# Patient Record
Sex: Female | Born: 1960 | Race: White | Hispanic: No | Marital: Married | Smoking: Former smoker
Health system: Southern US, Community
[De-identification: ages and names within clinical notes are randomized; demographics above are authoritative.]

## PROBLEM LIST (undated history)

## (undated) DIAGNOSIS — Z950 Presence of cardiac pacemaker: Secondary | ICD-10-CM

## (undated) DIAGNOSIS — I4891 Unspecified atrial fibrillation: Secondary | ICD-10-CM

## (undated) DIAGNOSIS — F419 Anxiety disorder, unspecified: Secondary | ICD-10-CM

## (undated) DIAGNOSIS — E119 Type 2 diabetes mellitus without complications: Secondary | ICD-10-CM

## (undated) DIAGNOSIS — E039 Hypothyroidism, unspecified: Secondary | ICD-10-CM

## (undated) DIAGNOSIS — K219 Gastro-esophageal reflux disease without esophagitis: Secondary | ICD-10-CM

## (undated) DIAGNOSIS — I10 Essential (primary) hypertension: Secondary | ICD-10-CM

## (undated) DIAGNOSIS — G4730 Sleep apnea, unspecified: Secondary | ICD-10-CM

## (undated) DIAGNOSIS — M1990 Unspecified osteoarthritis, unspecified site: Secondary | ICD-10-CM

## (undated) HISTORY — PX: LAPAROSCOPIC GASTRIC SLEEVE RESECTION WITH HIATAL HERNIA REPAIR: SHX6512

## (undated) HISTORY — PX: TONSILLECTOMY: SUR1361

## (undated) HISTORY — PX: ULNAR SHORTENING WITH BONE GRAFT: SHX6330

## (undated) HISTORY — PX: INSERT / REPLACE / REMOVE PACEMAKER: SUR710

## (undated) HISTORY — PX: CHOLECYSTECTOMY: SHX55

## (undated) HISTORY — PX: ABDOMINAL HYSTERECTOMY: SHX81

## (undated) HISTORY — PX: ACHILLES TENDON REPAIR: SUR1153

## (undated) HISTORY — PX: PLANTAR FASCIA RELEASE: SHX2239

---

## 1998-04-20 ENCOUNTER — Encounter: Admission: RE | Admit: 1998-04-20 | Discharge: 1998-07-19 | Payer: Self-pay | Admitting: Anesthesiology

## 1998-07-01 ENCOUNTER — Ambulatory Visit (HOSPITAL_COMMUNITY): Admission: RE | Admit: 1998-07-01 | Discharge: 1998-07-01 | Payer: Self-pay | Admitting: *Deleted

## 1998-07-24 ENCOUNTER — Ambulatory Visit (HOSPITAL_COMMUNITY): Admission: RE | Admit: 1998-07-24 | Discharge: 1998-07-24 | Payer: Self-pay | Admitting: *Deleted

## 1998-08-21 ENCOUNTER — Ambulatory Visit (HOSPITAL_COMMUNITY): Admission: RE | Admit: 1998-08-21 | Discharge: 1998-08-21 | Payer: Self-pay | Admitting: Family Medicine

## 1998-08-31 ENCOUNTER — Ambulatory Visit (HOSPITAL_COMMUNITY): Admission: RE | Admit: 1998-08-31 | Discharge: 1998-08-31 | Payer: Self-pay | Admitting: Family Medicine

## 1998-08-31 ENCOUNTER — Encounter: Payer: Self-pay | Admitting: Family Medicine

## 1999-06-06 ENCOUNTER — Inpatient Hospital Stay (HOSPITAL_COMMUNITY): Admission: AD | Admit: 1999-06-06 | Discharge: 1999-06-09 | Payer: Self-pay | Admitting: Cardiology

## 1999-12-06 ENCOUNTER — Other Ambulatory Visit: Admission: RE | Admit: 1999-12-06 | Discharge: 1999-12-06 | Payer: Self-pay | Admitting: *Deleted

## 2003-07-29 ENCOUNTER — Encounter: Payer: Self-pay | Admitting: Family Medicine

## 2003-07-29 ENCOUNTER — Encounter: Admission: RE | Admit: 2003-07-29 | Discharge: 2003-07-29 | Payer: Self-pay | Admitting: Family Medicine

## 2003-12-16 ENCOUNTER — Ambulatory Visit (HOSPITAL_COMMUNITY): Admission: RE | Admit: 2003-12-16 | Discharge: 2003-12-17 | Payer: Self-pay | Admitting: Orthopedic Surgery

## 2005-05-17 ENCOUNTER — Ambulatory Visit (HOSPITAL_COMMUNITY): Admission: RE | Admit: 2005-05-17 | Discharge: 2005-05-17 | Payer: Self-pay | Admitting: Obstetrics and Gynecology

## 2005-08-09 ENCOUNTER — Other Ambulatory Visit: Admission: RE | Admit: 2005-08-09 | Discharge: 2005-08-09 | Payer: Self-pay | Admitting: Obstetrics and Gynecology

## 2005-09-10 ENCOUNTER — Encounter: Admission: RE | Admit: 2005-09-10 | Discharge: 2005-10-25 | Payer: Self-pay | Admitting: Family Medicine

## 2005-10-14 ENCOUNTER — Encounter: Admission: RE | Admit: 2005-10-14 | Discharge: 2005-10-14 | Payer: Self-pay | Admitting: Family Medicine

## 2005-10-23 ENCOUNTER — Encounter: Admission: RE | Admit: 2005-10-23 | Discharge: 2005-10-23 | Payer: Self-pay | Admitting: Family Medicine

## 2006-08-13 ENCOUNTER — Ambulatory Visit (HOSPITAL_COMMUNITY): Admission: RE | Admit: 2006-08-13 | Discharge: 2006-08-13 | Payer: Self-pay | Admitting: Obstetrics and Gynecology

## 2007-10-19 ENCOUNTER — Ambulatory Visit (HOSPITAL_COMMUNITY): Admission: RE | Admit: 2007-10-19 | Discharge: 2007-10-19 | Payer: Self-pay | Admitting: Obstetrics and Gynecology

## 2007-11-05 HISTORY — PX: KNEE ARTHROSCOPY: SUR90

## 2008-03-10 ENCOUNTER — Ambulatory Visit (HOSPITAL_COMMUNITY): Admission: RE | Admit: 2008-03-10 | Discharge: 2008-03-11 | Payer: Self-pay | Admitting: Orthopedic Surgery

## 2008-06-09 ENCOUNTER — Ambulatory Visit: Payer: Self-pay | Admitting: Vascular Surgery

## 2008-06-21 ENCOUNTER — Ambulatory Visit: Payer: Self-pay | Admitting: Vascular Surgery

## 2009-02-09 ENCOUNTER — Ambulatory Visit (HOSPITAL_COMMUNITY): Admission: RE | Admit: 2009-02-09 | Discharge: 2009-02-09 | Payer: Self-pay | Admitting: *Deleted

## 2009-06-21 ENCOUNTER — Ambulatory Visit (HOSPITAL_BASED_OUTPATIENT_CLINIC_OR_DEPARTMENT_OTHER): Admission: RE | Admit: 2009-06-21 | Discharge: 2009-06-21 | Payer: Self-pay | Admitting: *Deleted

## 2010-11-24 ENCOUNTER — Encounter: Payer: Self-pay | Admitting: Gastroenterology

## 2010-11-24 ENCOUNTER — Encounter: Payer: Self-pay | Admitting: *Deleted

## 2010-11-25 ENCOUNTER — Encounter: Payer: Self-pay | Admitting: Family Medicine

## 2010-11-25 ENCOUNTER — Encounter: Payer: Self-pay | Admitting: Obstetrics and Gynecology

## 2010-11-25 ENCOUNTER — Encounter: Payer: Self-pay | Admitting: *Deleted

## 2010-11-26 ENCOUNTER — Encounter: Payer: Self-pay | Admitting: Family Medicine

## 2011-02-09 LAB — POCT I-STAT, CHEM 8
Calcium, Ion: 1.19 mmol/L (ref 1.12–1.32)
Creatinine, Ser: 0.8 mg/dL (ref 0.4–1.2)
Glucose, Bld: 117 mg/dL — ABNORMAL HIGH (ref 70–99)
Hemoglobin: 15 g/dL (ref 12.0–15.0)
Sodium: 139 mEq/L (ref 135–145)
TCO2: 28 mmol/L (ref 0–100)

## 2011-03-19 NOTE — Consult Note (Signed)
VASCULAR SURGERY CONSULTATION   Suzanne Bryan, Suzanne Bryan  DOB:  07-06-1961                                       06/21/2008  [id number]#:[id number]   Referred by Dr. Nolon Bussing. Hilts.  The patient was referred for vascular  surgery consultation regarding pain and swelling in both feet.  This 50-  year-old female states that she has been having pain in her feet for  several years and lately has been having some swelling in both feet and  ankle areas.  This worsens as the day progresses.  It is partially  relieved by getting off her feet and elevating the legs.  She has no  history of varicose veins, deep venous thrombosis, thrombophlebitis,  pulmonary emboli or any clotting problems and has had no bleeding or  ulceration in her lower extremities.  She has tried wearing elastic  compression stockings (short leg) for the past month with some  improvement.  She has had problems with tarsal tunnel release surgery on  the left and plantar fasciitis on the right and is currently being  evaluated for various orthopedic problems.  She had a right knee  arthroscopy 3 months ago.   PAST MEDICAL HISTORY:  1. Hypertension.  2. Chronic atrial fibrillation with pacemaker.  3. Hypothyroidism.  4. Negative for coronary artery disease or hyperlipidemia.  5. Negative for diabetes.   PAST SURGICAL HISTORY:  1. Right knee arthroscopy.  2. Release of plantar fasciitis.  3. Partial tunnel release on the left.   FAMILY HISTORY:  Is positive for coronary artery disease in her father,  diabetes in her grandfather.  Negative for stroke.   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  She has not smoked since 1986 and does not use alcohol.   REVIEW OF SYSTEMS:  The patient is obese and has gained 30 pounds  recently she states.  Has dyspnea not only at rest but also with  exertion and has palpitations.  Does have problems with diarrhea, pain  in her legs with walking,  headaches, diffuse joint pain, depression and  visual changes.   ALLERGIES:  Percocet, aspirin and broxi.   MEDICATIONS:  Please see health history form.  These include Coumadin.   PHYSICAL EXAM:  Vital signs:  Blood pressure is 131/90, heart rate is  96, respirations 14.  General:  She is a morbidly obese middle-aged  female who is in no apparent distress.  Alert and oriented x3.  Neck:  Supple, 3+ carotid pulses palpable.  No bruits are audible.  Neurological:  Normal.  No palpable adenopathy in the neck.  Chest:  Clear to auscultation.  Cardiovascular:  Regular rhythm with no murmurs.  Abdomen:  Is obese.  No palpable masses.  She has 3+ femoral, popliteal,  dorsalis pedis and posterior tibial pulses bilaterally.  Both feet are  well-perfused.  There is no evidence of any venous disease with no  hyperpigmentation, ulceration, edema that is appreciable or stasis  ulcers.  She has no point tenderness in the feet.  No evidence of  ischemia.   A venous duplex exam was performed in our lab last week and reveals no  evidence of deep venous thrombosis, deep venous insufficiency or reflux  in both lower extremities including the saphenous veins and deep veins.   I see no evidence  of arterial or venous insufficiency to be causing her  symptoms of pain in the feet and symmetrical swelling.  I think weight  loss would probably improve her symptomatology more than anything and  she is attempting to do that.  I do not think any further vascular  evaluation is indicated at this time.  She could continue to wear  elastic compression stockings for some symptomatic relief for this  symmetrical edema.   Quita Skye Hart [place], M.Bryan.  Electronically Signed  JDL/MEDQ  Bryan:  06/21/2008  T:  06/22/2008  Job:  [id number]   cc:   Lillia Carmel, M.Bryan.

## 2011-03-19 NOTE — Op Note (Signed)
NAME:  BRAXTON, WEISBECKER NO.:  [id number]   MEDICAL RECORD NO.:  [id number]          PATIENT TYPE:  OIB   LOCATION:  5029                         FACILITY:  [facility]   PHYSICIAN:  Nadara Mustard, MD     DATE OF BIRTH:  [birth year]   DATE OF PROCEDURE:  03/10/2008  DATE OF DISCHARGE:                               OPERATIVE REPORT   PREOPERATIVE DIAGNOSIS:  Osteoarthritis of the right knee.   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   PROCEDURE:  1. Ablation chondroplasty of medial femoral condyle, lateral femoral      condyle, patella, and trochlea, back to bleeding viable subcondylar      bone.  2. Partial medial meniscectomy.   SURGEON:  Nadara Mustard, MD.   ANESTHESIA:  General.   ESTIMATED BLOOD LOSS:  Minimal.   ANTIBIOTICS:  None.   DRAINS:  None.   COMPLICATIONS:  None.   DISPOSITION:  To PACU in stable condition.   INDICATIONS FOR PROCEDURE:  The patient is a 50 year old woman with  osteoarthritis of her right knee.  She has pain with activities of daily  living, has failed conservative care, and presents at this time for  arthroscopic intervention.  Risks and benefits were discussed including  infection, neurovascular injury, persistent pain, and need for  additional surgery.  The patient states she understands and wishes to  proceed at this time.   DESCRIPTION OF PROCEDURE:  The patient was brought to OR room 10 and  underwent a general anesthetic.  After adequate level of anesthesia was  obtained, the patient's left lower extremity was prepped using DuraPrep  and draped into a sterile field.  The scope was inserted through the  inferior lateral portal and inferior medial working portal was  established.  Visualization of the medial line joint line with valgus  stress along the knee showed the patient to have a large osteochondral  defect in the medial femoral condyle as well as degenerative tearing of  the medial meniscus.  The patient underwent ablation chondroplasty, back  to bleeding viable subcondylar bone with the shaver and a rasp.  She  underwent partial medial meniscectomy back to a stable margin  examination.  Examination of that showed an intact ACL.  Examination of  lateral joint line in figure-of-four position showed a small 1-cm  diameter osteochondral defect and this was debrided to back to bleeding  viable subcondylar bone with the shaver.  She had an intact lateral  meniscus.  Examination of the patellofemoral joint with knee extended  also showed osteochondral defect of the patella and trochlea.  The  patient underwent ablation chondroplasty back to bleeding viable  subcondylar bone of the patella and trochlea.  There was a small plaque  and this was also debrided.  There were no loose bodies in the medial or  lateral gutters or the suprapatellar pouch.  The instruments were  removed after a survey of all 3 compartments again and showed no loose  bodies.  The instruments were  removed.  The portals were closed using 3-  0 nylon.  The joint was infused with a total of 30 mL of 0.5% Marcaine  plain.  The wounds were covered with Adaptic, orthopedic sponges, ABD  dressing, Webril, and Coban.  The patient was extubated and taken to  PACU in stable condition.  Plan for a 23-hour observation due to the  patient's use of a CPAP and presence of a pacemaker.  Plan to discharge  to home in the morning.      Nadara Mustard, MD  Electronically Signed     MVD/MEDQ  D:  03/10/2008  T:  03/10/2008  Job:  [id number]

## 2011-03-19 NOTE — Op Note (Signed)
NAME:  Suzanne Bryan, Suzanne Bryan NO.:  [id number]   MEDICAL RECORD NO.:  [id number]          PATIENT TYPE:  AMB   LOCATION:  DSC                          FACILITY:  [facility]   PHYSICIAN:  Tennis Must Meyerdierks, M.D.DATE OF BIRTH:  [birth year]   DATE OF PROCEDURE:  06/21/2009  DATE OF DISCHARGE:                               OPERATIVE REPORT   PREOPERATIVE DIAGNOSIS:  Ulnocarpal abutment, right wrist.   POSTOPERATIVE DIAGNOSIS:  Ulnocarpal abutment, right wrist.   PROCEDURE:  Ulnar shortening right forearm.   SURGEON:  Lowell Bouton, MD   ANESTHESIA:  General with axillary block.   OPERATIVE FINDINGS:  The patient had no abnormality of her mid ulna.   PROCEDURE:  Under general anesthesia with a tourniquet on the right arm,  the right arm was prepped and draped in usual fashion and after  exsanguinating the limb the tourniquet was inflated to 250 mmHg.  A  longitudinal incision was made over the subcutaneous border of the ulna  from the styloid proximally.  It was carried down through the  subcutaneous tissues and bleeding points were coagulated.  Blunt  dissection was carried down between the flexor and extensor muscle mass  and the subcutaneous border of the ulna was identified.  Periosteum was  incised with a knife and a Therapist, nutritional was used to elevate the  periosteum.  The Rayhack Ulnar Shortening System was used.  The plate  was free contoured with plate bending irons.  A slightly concave flex  was placed in the plate so that it fit appropriately on the ulna.  The  plate was then removed and the jig was inserted onto the ulna putting  four 3.5-mm screws into the first, second, third and fourth holes.  The  osteotomy was then performed with a saw using the second and first slot.  This allowed for shortening of 3.5 mm.  After the osteotomy was  performed using plenty of saline the jig was removed and the plate was  applied.  The first and second screws  were inserted and then the  shortening device was placed with the third and fourth screws using 4 mm  longer screws.  Once the shortening device was in place, a screwdriver  was used to shorten the bone down so that the osteotomy closed allowing  for the 3.5 mm shortening.  A jig was then applied to place a  compression screw across the osteotomy site and this was done with a 2.7  mm drill bit in the proximal cortex and 2.0 mm drill bit in the distal  cortex.  A 2.7-mm screw was then inserted compressing the osteotomy  site.  Two distal screws were then inserted using a 3.5-mm screws and  the compression device was removed.  The remaining third and fourth  screw holes were filled with the original 14 mm length screws.  X-rays  showed good position of the osteotomy and good length of the screws.  The relative length of the ulna did appear shorter than the radius.  The  wound was then irrigated copiously with saline.  The subcutaneous  tissues were closed with 4-0 Vicryl, the skin with a 3-0 subcuticular  Prolene.  Steri-Strips were applied followed by sterile  dressing.  The vessel loop drain was left in the wound for drainage.  The patient was placed in volar and dorsal splint immobilizing the wrist  and forearm.  She tolerated the procedure well and went to the recovery  room awake in stable and in good condition.      Lowell Bouton, M.D.  Electronically Signed     EMM/MEDQ  D:  06/21/2009  T:  06/22/2009  Job:  [id number]

## 2011-03-19 NOTE — Procedures (Signed)
DUPLEX DEEP VENOUS EXAM - LOWER EXTREMITY   INDICATION:  Bilateral foot pain and swelling.   HISTORY:  Edema:  Bilateral foot swelling.  Trauma/Surgery:  Right knee arthroscopic surgery about 3 months ago, per  patient.  Pain:  Bilateral feet.  PE:  No.  Previous DVT:  No.  Anticoagulants:  No.  Other:   DUPLEX EXAM:                CFV   SFV   PopV  PTV    GSV                R  L  R  L  R  L  R   L  R  L  Thrombosis    0  0  0  0  0  0  0   0  0  0  Spontaneous   +  +  +  +  +  +  +   +  +  +  Phasic        +  +  +  +  +  +  +   +  +  +  Augmentation  +  +  +  +  +  +  +   +  +  +  Compressible  +  +  +  +  +  +  +   +  +  +  Competent     +  +  +  +  +  +  +   +  +  +   Legend:  + - yes  o - no  p - partial  D - decreased   IMPRESSION:  No evidence of deep vein thrombosis or reflux noted in the  bilateral lower extremities.    _____________________________  V. Charlena Cross, MD   CH/MEDQ  D:  06/09/2008  T:  06/09/2008  Job:  [id number]

## 2011-03-22 NOTE — Op Note (Signed)
NAME:  Suzanne Bryan, Suzanne Bryan                       ACCOUNT NO.:  [id number]   MEDICAL RECORD NO.:  [id number]                   PATIENT TYPE:  OIB   LOCATION:  4702                                 FACILITY:  [facility]   PHYSICIAN:  Loreta Ave, M.D.              DATE OF BIRTH:  [birth year]   DATE OF PROCEDURE:  12/16/2003  DATE OF DISCHARGE:                                 OPERATIVE REPORT   PREOPERATIVE DIAGNOSES:  Recalcitrant symptomatic plantar fasciitis right  heel.   POSTOPERATIVE DIAGNOSES:  Recalcitrant symptomatic plantar fasciitis right  heel.   OPERATION/PROCEDURE:  Endoscopic plantar fascia release right heel.   SURGEON:  Loreta Ave, M.D.   ASSISTANT:  Arlys John D. Petrarca, P.A.-C.   ANESTHESIA:  General.   ESTIMATED BLOOD LOSS:  Minimal.   TOURNIQUET TIME:  30 minutes.   SPECIMENS:  None.   CULTURES:  None.   COMPLICATIONS:  None.   DRESSINGS:  Sterile compressive with a wooden shoe.   DESCRIPTION OF PROCEDURE:  The patient brought to the operating room, placed  on the operating table in supine position.  After adequate anesthesia had  been obtained, tourniquet applied, right calf.  Prepped and draped in the  usual sterile fashion.  Exsanguinated with elevation esmarch, tourniquet  inflated to 250 mmHg.  Plantar fascia attachment to the os calcis was  identified with fluoroscopic guidance.  Small incisions were made on the  medial and lateral aspect. The endoscopic instruments were then used to  clear off the plantar side of the plantar fascia and the cannula was placed  across the foot to allow for endoscopic release.  With both fluoroscopic and  arthroscopic guidance, the plantar fascia was identified and with the  endoscopic knife was divided in its entirety from the medial to the lateral  aspect down to the short flexor muscle below.  Good confirmation of release  throughout viewing throughout and protecting neurovascular structures and  skin on both  sides. Once this was accomplished, the wound was irrigated and  injected with Marcaine.  Incisions were closed with nylon. Sterile  compressive dressing applied. Tourniquet deflated and removed.  Wound shield  applied.  Anesthesia reversed. Brought to the recovery room.  She tolerated  the surgery well with no complications.                                              Loreta Ave, M.D.   DFM/MEDQ  D:  12/16/2003  T:  12/17/2003  Job:  [id number]

## 2014-04-06 ENCOUNTER — Ambulatory Visit (INDEPENDENT_AMBULATORY_CARE_PROVIDER_SITE_OTHER): Payer: PRIVATE HEALTH INSURANCE

## 2014-04-06 ENCOUNTER — Encounter: Payer: Self-pay | Admitting: Sports Medicine

## 2014-04-06 ENCOUNTER — Ambulatory Visit (INDEPENDENT_AMBULATORY_CARE_PROVIDER_SITE_OTHER): Payer: PRIVATE HEALTH INSURANCE | Admitting: Sports Medicine

## 2014-04-06 DIAGNOSIS — Z95810 Presence of automatic (implantable) cardiac defibrillator: Secondary | ICD-10-CM

## 2014-04-06 DIAGNOSIS — D1620 Benign neoplasm of long bones of unspecified lower limb: Secondary | ICD-10-CM

## 2014-04-06 DIAGNOSIS — M5380 Other specified dorsopathies, site unspecified: Secondary | ICD-10-CM | POA: Diagnosis not present

## 2014-04-06 DIAGNOSIS — M439 Deforming dorsopathy, unspecified: Secondary | ICD-10-CM | POA: Diagnosis not present

## 2014-04-06 DIAGNOSIS — M899 Disorder of bone, unspecified: Secondary | ICD-10-CM

## 2014-04-06 DIAGNOSIS — Z9889 Other specified postprocedural states: Secondary | ICD-10-CM

## 2014-04-06 DIAGNOSIS — M25529 Pain in unspecified elbow: Secondary | ICD-10-CM

## 2014-04-06 DIAGNOSIS — S9000XA Contusion of unspecified ankle, initial encounter: Secondary | ICD-10-CM

## 2014-04-06 DIAGNOSIS — M25569 Pain in unspecified knee: Secondary | ICD-10-CM | POA: Diagnosis not present

## 2014-04-06 DIAGNOSIS — M949 Disorder of cartilage, unspecified: Secondary | ICD-10-CM

## 2014-04-06 DIAGNOSIS — M7730 Calcaneal spur, unspecified foot: Secondary | ICD-10-CM | POA: Diagnosis not present

## 2014-04-06 DIAGNOSIS — S139XXA Sprain of joints and ligaments of unspecified parts of neck, initial encounter: Secondary | ICD-10-CM

## 2014-04-06 DIAGNOSIS — M5030 Other cervical disc degeneration, unspecified cervical region: Secondary | ICD-10-CM | POA: Diagnosis not present

## 2014-04-06 DIAGNOSIS — R079 Chest pain, unspecified: Secondary | ICD-10-CM

## 2014-04-06 DIAGNOSIS — S335XXA Sprain of ligaments of lumbar spine, initial encounter: Secondary | ICD-10-CM

## 2014-04-06 DIAGNOSIS — M25519 Pain in unspecified shoulder: Secondary | ICD-10-CM

## 2014-04-06 DIAGNOSIS — M4040 Postural lordosis, site unspecified: Secondary | ICD-10-CM | POA: Diagnosis not present

## 2014-04-06 DIAGNOSIS — M25539 Pain in unspecified wrist: Secondary | ICD-10-CM

## 2014-04-06 DIAGNOSIS — M47812 Spondylosis without myelopathy or radiculopathy, cervical region: Secondary | ICD-10-CM | POA: Diagnosis not present

## 2014-04-06 MED ORDER — TRAMADOL HCL 50 MG PO TABS: ORAL_TABLET | ORAL | Status: DC

## 2014-04-06 MED ORDER — PREDNISONE (PAK) 10 MG PO TABS: ORAL_TABLET | ORAL | Status: DC

## 2014-04-06 NOTE — Assessment & Plan Note (Signed)
With cervical strain, lumbar strain, contusion to the right ankle, right knee, and left ribs. Prednisone, continue Skelaxin that she already has, tramadol for breakthrough pain. We have to avoid NSAIDs, she is on xarelto. Return to see me in 2 weeks.
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
## 2014-04-15 ENCOUNTER — Telehealth: Payer: Self-pay

## 2014-04-15 NOTE — Telephone Encounter (Signed)
Patient called stated that she is still in pain all over and the Tramadol is helping but the muscle relaxer isn't working, she is currently on Skelaxin. Please advise that patient is out of town and she will give the name of the pharmacy when I call her back on what to do. Lynnett Langlinais,CMA

## 2014-04-15 NOTE — Telephone Encounter (Signed)
Called left a message on patient vm to call me back regarding the pharmacy that she wants her Flexeril sent to . Darion Milewski,CMA

## 2014-04-15 NOTE — Telephone Encounter (Signed)
Switch her from Skelaxin to Flexeril 10 mg tablets, 0.5-1 tablet every 8 hours as needed for pain/spasm. #30. Refill zero.

## 2014-04-18 ENCOUNTER — Other Ambulatory Visit: Payer: Self-pay

## 2014-04-18 MED ORDER — CYCLOBENZAPRINE HCL 10 MG PO TABS: ORAL_TABLET | ORAL | Status: DC

## 2014-04-18 NOTE — Telephone Encounter (Signed)
Flexeril 10 mg has been sent to [facility] [place]. Coltyn Hanning,CMA

## 2014-04-25 ENCOUNTER — Ambulatory Visit: Payer: PRIVATE HEALTH INSURANCE | Admitting: Sports Medicine

## 2014-04-27 ENCOUNTER — Other Ambulatory Visit: Payer: Self-pay | Admitting: Sports Medicine

## 2014-04-28 ENCOUNTER — Encounter: Payer: Self-pay | Admitting: Sports Medicine

## 2014-04-28 ENCOUNTER — Telehealth: Payer: Self-pay | Admitting: *Deleted

## 2014-04-28 ENCOUNTER — Ambulatory Visit (INDEPENDENT_AMBULATORY_CARE_PROVIDER_SITE_OTHER): Payer: PRIVATE HEALTH INSURANCE | Admitting: Sports Medicine

## 2014-04-28 VITALS — BP 121/87 | HR 82 | Ht 64.0 in | Wt 210.0 lb

## 2014-04-28 DIAGNOSIS — M1711 Unilateral primary osteoarthritis, right knee: Secondary | ICD-10-CM | POA: Insufficient documentation

## 2014-04-28 DIAGNOSIS — M1710 Unilateral primary osteoarthritis, unspecified knee: Secondary | ICD-10-CM

## 2014-04-28 DIAGNOSIS — M79609 Pain in unspecified limb: Secondary | ICD-10-CM

## 2014-04-28 DIAGNOSIS — M79671 Pain in right foot: Secondary | ICD-10-CM | POA: Insufficient documentation

## 2014-04-28 DIAGNOSIS — M5136 Other intervertebral disc degeneration, lumbar region: Secondary | ICD-10-CM | POA: Insufficient documentation

## 2014-04-28 DIAGNOSIS — M5030 Other cervical disc degeneration, unspecified cervical region: Secondary | ICD-10-CM

## 2014-04-28 DIAGNOSIS — M5137 Other intervertebral disc degeneration, lumbosacral region: Secondary | ICD-10-CM

## 2014-04-28 DIAGNOSIS — M51379 Other intervertebral disc degeneration, lumbosacral region without mention of lumbar back pain or lower extremity pain: Secondary | ICD-10-CM

## 2014-04-28 DIAGNOSIS — M51369 Other intervertebral disc degeneration, lumbar region without mention of lumbar back pain or lower extremity pain: Secondary | ICD-10-CM | POA: Insufficient documentation

## 2014-04-28 MED ORDER — VENLAFAXINE HCL 75 MG PO TABS: 75.0000 mg | ORAL_TABLET | Freq: Two times a day (BID) | ORAL | Status: DC

## 2014-04-28 NOTE — Telephone Encounter (Signed)
Prior auth for CT cervical spine as per [facility] @ [employer] good thru 04/28/14-07/27/14. [id number]. Margette Fast, CMA

## 2014-04-28 NOTE — Assessment & Plan Note (Signed)
Dr. Percell Miller is waiting a month before pursuing any further treatment of her knee osteoarthritis.

## 2014-04-28 NOTE — Assessment & Plan Note (Signed)
Has a pacemaker, unable to pursue MRI. CT of the cervical spine, she will then pursue epidurals with Dr. Ron Agee
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
## 2014-04-28 NOTE — Assessment & Plan Note (Addendum)
Persistent radicular symptoms. It sounds as though epidurals have been dramatically effective in the past. She is going to do this with Dr. Ron Agee I am increasing Effexor 225 mg.

## 2014-04-28 NOTE — Assessment & Plan Note (Signed)
Injections placed into the first intermetatarsal bursae and the extensor hallucis longus tendon sheath. Return in a month.

## 2014-05-04 ENCOUNTER — Telehealth: Payer: Self-pay | Admitting: *Deleted

## 2014-05-04 ENCOUNTER — Other Ambulatory Visit: Payer: Self-pay | Admitting: Sports Medicine

## 2014-05-04 DIAGNOSIS — M5136 Other intervertebral disc degeneration, lumbar region: Secondary | ICD-10-CM

## 2014-05-04 DIAGNOSIS — M51369 Other intervertebral disc degeneration, lumbar region without mention of lumbar back pain or lower extremity pain: Secondary | ICD-10-CM

## 2014-05-04 NOTE — Assessment & Plan Note (Signed)
She is getting lumbar epidurals with [facility]. Per patient request I am ordering a CT of the lumbar spine, due to a pacemaker she cannot have an MRI.

## 2014-05-04 NOTE — Telephone Encounter (Signed)
CT of lumbar w/o was added by Dr. Nydia Bouton today and prior was obtained, radiology was called. Auth from [facility] - [id number] good 05/05/14 thru 08/03/14. Margette Fast, CMA

## 2014-05-11 ENCOUNTER — Telehealth: Payer: Self-pay | Admitting: *Deleted

## 2014-05-11 ENCOUNTER — Ambulatory Visit (INDEPENDENT_AMBULATORY_CARE_PROVIDER_SITE_OTHER): Payer: PRIVATE HEALTH INSURANCE

## 2014-05-11 DIAGNOSIS — M5136 Other intervertebral disc degeneration, lumbar region: Secondary | ICD-10-CM

## 2014-05-11 DIAGNOSIS — M5030 Other cervical disc degeneration, unspecified cervical region: Secondary | ICD-10-CM

## 2014-05-11 DIAGNOSIS — M5126 Other intervertebral disc displacement, lumbar region: Secondary | ICD-10-CM

## 2014-05-11 NOTE — Telephone Encounter (Signed)
I spoke with Nathaneil Canary @ [facility] this morning regarding CT cervical w/o [address] and he updated that prior auth for this procedure and the approval number stays the same. Previously, Arianna from [facility], [place] a with and w/out procedure which was not ordered. Bonnita Nasuti in radiology was notified of update. Margette Fast, CMA

## 2014-05-23 ENCOUNTER — Ambulatory Visit (INDEPENDENT_AMBULATORY_CARE_PROVIDER_SITE_OTHER): Payer: PRIVATE HEALTH INSURANCE | Admitting: Sports Medicine

## 2014-05-23 ENCOUNTER — Encounter: Payer: Self-pay | Admitting: Sports Medicine

## 2014-05-23 VITALS — BP 119/79 | HR 70 | Ht 64.0 in | Wt 212.0 lb

## 2014-05-23 DIAGNOSIS — M5030 Other cervical disc degeneration, unspecified cervical region: Secondary | ICD-10-CM

## 2014-05-23 DIAGNOSIS — M79671 Pain in right foot: Secondary | ICD-10-CM

## 2014-05-23 DIAGNOSIS — M5137 Other intervertebral disc degeneration, lumbosacral region: Secondary | ICD-10-CM

## 2014-05-23 DIAGNOSIS — M51379 Other intervertebral disc degeneration, lumbosacral region without mention of lumbar back pain or lower extremity pain: Secondary | ICD-10-CM

## 2014-05-23 DIAGNOSIS — M79609 Pain in unspecified limb: Secondary | ICD-10-CM

## 2014-05-23 DIAGNOSIS — M5136 Other intervertebral disc degeneration, lumbar region: Secondary | ICD-10-CM

## 2014-05-23 DIAGNOSIS — M51369 Other intervertebral disc degeneration, lumbar region without mention of lumbar back pain or lower extremity pain: Secondary | ICD-10-CM

## 2014-05-23 NOTE — Assessment & Plan Note (Signed)
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 She will be going to Dr. Ron Agee at [facility] for injections.

## 2014-05-23 NOTE — Assessment & Plan Note (Signed)
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 She will be going to Dr. Ron Agee at [facility] for injections.

## 2014-05-23 NOTE — Assessment & Plan Note (Signed)
Good response to extensor hallucis longus and first intermetatarsal bursa injection at the last visit. Return for custom orthotics, she does have very significant pes cavus.

## 2014-05-23 NOTE — Progress Notes (Signed)
  Subjective:    CC: Followup  HPI: Cervical radiculitis: CT did show multilevel cervical spondylosis, getting set up for epidurals with another provider.  Lumbar radiculitis: CT again showed multilevel lumbar spondylosis, getting set up for epidurals with another provider. She was unable to do an MRI due to implanted device.  Right foot pain: Improved significantly after first intermetatarsal and extensor hallucis longus tendon sheath injection.  Past medical history, Surgical history, Family history not pertinant except as noted below, Social history, Allergies, and medications have been entered into the medical record, reviewed, and no changes needed.   Review of Systems: No fevers, chills, night sweats, weight loss, chest pain, or shortness of breath.   Objective:    General: Well Developed, well nourished, and in no acute distress.  Neuro: Alert and oriented x3, extra-ocular muscles intact, sensation grossly intact.  HEENT: Normocephalic, atraumatic, pupils equal round reactive to light, neck supple, no masses, no lymphadenopathy, thyroid nonpalpable.  Skin: Warm and dry, no rashes. Cardiac: Regular rate and rhythm, no murmurs rubs or gallops, no lower extremity edema.  Respiratory: Clear to auscultation bilaterally. Not using accessory muscles, speaking in full sentences. Right Foot: No visible erythema or swelling. Range of motion is full in all directions. Strength is 5/5 in all directions. No hallux valgus. Pes cavus present. No abnormal callus noted. No pain over the navicular prominence, or base of fifth metatarsal. No tenderness to palpation of the calcaneal insertion of plantar fascia. No pain at the Achilles insertion. No pain over the calcaneal bursa. No pain of the retrocalcaneal bursa. No tenderness to palpation over the tarsals, metatarsals, or phalanges. No hallux rigidus or limitus. No tenderness palpation over interphalangeal joints. No pain with compression  of the metatarsal heads. Neurovascularly intact distally.  Impression and Recommendations:    I spent 40 minutes with this patient, greater than 50% was face-to-face time counseling regarding the above diagnosis

## 2014-06-03 ENCOUNTER — Ambulatory Visit: Payer: PRIVATE HEALTH INSURANCE | Admitting: Sports Medicine

## 2014-06-03 ENCOUNTER — Ambulatory Visit (INDEPENDENT_AMBULATORY_CARE_PROVIDER_SITE_OTHER): Payer: PRIVATE HEALTH INSURANCE

## 2014-06-03 DIAGNOSIS — M6281 Muscle weakness (generalized): Secondary | ICD-10-CM

## 2014-06-03 DIAGNOSIS — M542 Cervicalgia: Secondary | ICD-10-CM

## 2014-06-03 DIAGNOSIS — M1710 Unilateral primary osteoarthritis, unspecified knee: Secondary | ICD-10-CM

## 2014-06-03 DIAGNOSIS — Z0289 Encounter for other administrative examinations: Secondary | ICD-10-CM

## 2014-06-06 ENCOUNTER — Encounter: Payer: PRIVATE HEALTH INSURANCE | Admitting: Physical Therapy

## 2014-06-06 ENCOUNTER — Encounter (INDEPENDENT_AMBULATORY_CARE_PROVIDER_SITE_OTHER): Payer: PRIVATE HEALTH INSURANCE | Admitting: Physical Therapy

## 2014-06-06 DIAGNOSIS — M1710 Unilateral primary osteoarthritis, unspecified knee: Secondary | ICD-10-CM

## 2014-06-06 DIAGNOSIS — M545 Low back pain, unspecified: Secondary | ICD-10-CM

## 2014-06-06 DIAGNOSIS — M542 Cervicalgia: Secondary | ICD-10-CM

## 2014-06-06 DIAGNOSIS — M6281 Muscle weakness (generalized): Secondary | ICD-10-CM

## 2014-06-06 DIAGNOSIS — R5381 Other malaise: Secondary | ICD-10-CM

## 2014-06-14 ENCOUNTER — Encounter (INDEPENDENT_AMBULATORY_CARE_PROVIDER_SITE_OTHER): Payer: PRIVATE HEALTH INSURANCE | Admitting: Physical Therapy

## 2014-06-14 DIAGNOSIS — M545 Low back pain, unspecified: Secondary | ICD-10-CM

## 2014-06-14 DIAGNOSIS — M1710 Unilateral primary osteoarthritis, unspecified knee: Secondary | ICD-10-CM

## 2014-06-14 DIAGNOSIS — R5381 Other malaise: Secondary | ICD-10-CM

## 2014-06-14 DIAGNOSIS — M6281 Muscle weakness (generalized): Secondary | ICD-10-CM

## 2014-06-14 DIAGNOSIS — M542 Cervicalgia: Secondary | ICD-10-CM

## 2014-06-17 ENCOUNTER — Encounter: Payer: PRIVATE HEALTH INSURANCE | Admitting: Physical Therapy

## 2014-06-21 ENCOUNTER — Encounter (INDEPENDENT_AMBULATORY_CARE_PROVIDER_SITE_OTHER): Payer: PRIVATE HEALTH INSURANCE | Admitting: Physical Therapy

## 2014-06-21 DIAGNOSIS — M545 Low back pain, unspecified: Secondary | ICD-10-CM

## 2014-06-21 DIAGNOSIS — M6281 Muscle weakness (generalized): Secondary | ICD-10-CM

## 2014-06-21 DIAGNOSIS — M542 Cervicalgia: Secondary | ICD-10-CM

## 2014-06-21 DIAGNOSIS — R5381 Other malaise: Secondary | ICD-10-CM

## 2014-06-21 DIAGNOSIS — M1710 Unilateral primary osteoarthritis, unspecified knee: Secondary | ICD-10-CM

## 2014-06-27 ENCOUNTER — Encounter (INDEPENDENT_AMBULATORY_CARE_PROVIDER_SITE_OTHER): Payer: PRIVATE HEALTH INSURANCE | Admitting: Physical Therapy

## 2014-06-27 DIAGNOSIS — M545 Low back pain, unspecified: Secondary | ICD-10-CM

## 2014-06-27 DIAGNOSIS — M542 Cervicalgia: Secondary | ICD-10-CM

## 2014-06-27 DIAGNOSIS — M6281 Muscle weakness (generalized): Secondary | ICD-10-CM

## 2014-06-27 DIAGNOSIS — R5381 Other malaise: Secondary | ICD-10-CM

## 2014-06-27 DIAGNOSIS — M1710 Unilateral primary osteoarthritis, unspecified knee: Secondary | ICD-10-CM

## 2014-06-30 ENCOUNTER — Encounter: Payer: PRIVATE HEALTH INSURANCE | Admitting: Physical Therapy

## 2014-10-25 ENCOUNTER — Other Ambulatory Visit: Payer: Self-pay | Admitting: Physician Assistant
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
## 2014-10-26 NOTE — Pre-Procedure Instructions (Signed)
Suzanne Bryan  10/26/2014   Your procedure is scheduled on:  Wednesday, January 6th  Report to [facility] at [phone] AM.  Call this number if you have problems the morning of surgery: [phone]   Remember:   Do not eat food or drink liquids after midnight.   Take these medicines the morning of surgery with A SIP OF WATER: norvasc, synthroid, prilosec, effexor, tramadol if needed   Do not wear jewelry, make-up or nail polish.  Do not wear lotions, powders, or perfume, deodorant.  Do not shave 48 hours prior to surgery. Men may shave face and neck.  Do not bring valuables to the hospital.  [facility] is not responsible  for any belongings or valuables.               Contacts, dentures or bridgework may not be worn into surgery.  Leave suitcase in the car. After surgery it may be brought to your room.  For patients admitted to the hospital, discharge time is determined by your  treatment team.         Please read over the following fact sheets that you were given: Pain Booklet, Coughing and Deep Breathing, Blood Transfusion Information, MRSA Information and Surgical Site Infection Prevention  [place] - Preparing for Surgery  Before surgery, you can play an important role.  Because skin is not sterile, your skin needs to be as free of germs as possible.  You can reduce the number of germs on you skin by washing with CHG (chlorahexidine gluconate) soap before surgery.  CHG is an antiseptic cleaner which kills germs and bonds with the skin to continue killing germs even after washing.  Please DO NOT use if you have an allergy to CHG or antibacterial soaps.  If your skin becomes reddened/irritated stop using the CHG and inform your nurse when you arrive at Short Stay.  Do not shave (including legs and underarms) for at least 48 hours prior to the first CHG shower.  You may shave your face.  Please follow these instructions carefully:   1.  Shower with CHG Soap  the night before surgery and the morning of Surgery.  2.  If you choose to wash your hair, wash your hair first as usual with your normal shampoo.  3.  After you shampoo, rinse your hair and body thoroughly to remove the shampoo.  4.  Use CHG as you would any other liquid soap.  You can apply CHG directly to the skin and wash gently with scrungie or a clean washcloth.  5.  Apply the CHG Soap to your body ONLY FROM THE NECK DOWN.  Do not use on open wounds or open sores.  Avoid contact with your eyes, ears, mouth and genitals (private parts).  Wash genitals (private parts) with your normal soap.  6.  Wash thoroughly, paying special attention to the area where your surgery will be performed.  7.  Thoroughly rinse your body with warm water from the neck down.  8.  DO NOT shower/wash with your normal soap after using and rinsing off the CHG Soap.  9.  Pat yourself dry with a clean towel.            10.  Wear clean pajamas.            11.  Place clean sheets on your bed the night of your first shower and do not sleep with pets.  Day of Surgery  Do not apply any lotions/deoderants the morning of surgery.  Please wear clean clothes to the hospital/surgery center.

## 2014-10-27 ENCOUNTER — Encounter (HOSPITAL_COMMUNITY)
Admission: RE | Admit: 2014-10-27 | Discharge: 2014-10-27 | Disposition: A | Discharge status: Home / Self Care | Payer: PRIVATE HEALTH INSURANCE | Source: Ambulatory Visit | Attending: Orthopedic Surgery | Admitting: Orthopedic Surgery

## 2014-10-27 ENCOUNTER — Encounter (HOSPITAL_COMMUNITY): Payer: Self-pay

## 2014-10-27 DIAGNOSIS — I272 Other secondary pulmonary hypertension: Secondary | ICD-10-CM | POA: Insufficient documentation

## 2014-10-27 DIAGNOSIS — E039 Hypothyroidism, unspecified: Secondary | ICD-10-CM | POA: Diagnosis not present

## 2014-10-27 DIAGNOSIS — I482 Chronic atrial fibrillation: Secondary | ICD-10-CM | POA: Insufficient documentation

## 2014-10-27 DIAGNOSIS — M1711 Unilateral primary osteoarthritis, right knee: Secondary | ICD-10-CM | POA: Insufficient documentation

## 2014-10-27 DIAGNOSIS — E669 Obesity, unspecified: Secondary | ICD-10-CM | POA: Diagnosis not present

## 2014-10-27 DIAGNOSIS — Z01818 Encounter for other preprocedural examination: Secondary | ICD-10-CM | POA: Insufficient documentation

## 2014-10-27 DIAGNOSIS — Z90710 Acquired absence of both cervix and uterus: Secondary | ICD-10-CM | POA: Diagnosis not present

## 2014-10-27 DIAGNOSIS — G4733 Obstructive sleep apnea (adult) (pediatric): Secondary | ICD-10-CM | POA: Insufficient documentation

## 2014-10-27 DIAGNOSIS — I4892 Unspecified atrial flutter: Secondary | ICD-10-CM | POA: Insufficient documentation

## 2014-10-27 DIAGNOSIS — M1990 Unspecified osteoarthritis, unspecified site: Secondary | ICD-10-CM | POA: Diagnosis not present

## 2014-10-27 DIAGNOSIS — E119 Type 2 diabetes mellitus without complications: Secondary | ICD-10-CM | POA: Diagnosis not present

## 2014-10-27 DIAGNOSIS — K219 Gastro-esophageal reflux disease without esophagitis: Secondary | ICD-10-CM | POA: Diagnosis not present

## 2014-10-27 DIAGNOSIS — I10 Essential (primary) hypertension: Secondary | ICD-10-CM | POA: Insufficient documentation

## 2014-10-27 DIAGNOSIS — Z87891 Personal history of nicotine dependence: Secondary | ICD-10-CM | POA: Diagnosis not present

## 2014-10-27 DIAGNOSIS — Z950 Presence of cardiac pacemaker: Secondary | ICD-10-CM | POA: Diagnosis not present

## 2014-10-27 DIAGNOSIS — F419 Anxiety disorder, unspecified: Secondary | ICD-10-CM | POA: Insufficient documentation

## 2014-10-27 DIAGNOSIS — Z9049 Acquired absence of other specified parts of digestive tract: Secondary | ICD-10-CM | POA: Insufficient documentation

## 2014-10-27 DIAGNOSIS — Z9884 Bariatric surgery status: Secondary | ICD-10-CM | POA: Diagnosis not present

## 2014-10-27 HISTORY — DX: Unspecified atrial fibrillation: I48.91

## 2014-10-27 HISTORY — DX: Type 2 diabetes mellitus without complications: E11.9

## 2014-10-27 HISTORY — DX: Unspecified osteoarthritis, unspecified site: M19.90

## 2014-10-27 HISTORY — DX: Presence of cardiac pacemaker: Z95.0

## 2014-10-27 HISTORY — DX: Sleep apnea, unspecified: G47.30

## 2014-10-27 HISTORY — DX: Essential (primary) hypertension: I10

## 2014-10-27 HISTORY — DX: Hypothyroidism, unspecified: E03.9

## 2014-10-27 HISTORY — DX: Anxiety disorder, unspecified: F41.9

## 2014-10-27 HISTORY — DX: Gastro-esophageal reflux disease without esophagitis: K21.9

## 2014-10-27 LAB — CBC WITH DIFFERENTIAL/PLATELET
Basophils Absolute: 0 10*3/uL (ref 0.0–0.1)
Basophils Relative: 0 % (ref 0–1)
Eosinophils Absolute: 0.2 10*3/uL (ref 0.0–0.7)
Eosinophils Relative: 2 % (ref 0–5)
HCT: 39.7 % (ref 36.0–46.0)
Hemoglobin: 12.8 g/dL (ref 12.0–15.0)
Lymphocytes Relative: 37 % (ref 12–46)
Lymphs Abs: 2.5 10*3/uL (ref 0.7–4.0)
MCH: 28.9 pg (ref 26.0–34.0)
MCHC: 32.2 g/dL (ref 30.0–36.0)
MCV: 89.6 fL (ref 78.0–100.0)
Monocytes Absolute: 0.6 10*3/uL (ref 0.1–1.0)
Monocytes Relative: 9 % (ref 3–12)
Neutro Abs: 3.5 10*3/uL (ref 1.7–7.7)
Neutrophils Relative %: 52 % (ref 43–77)
Platelets: 224 10*3/uL (ref 150–400)
RBC: 4.43 MIL/uL (ref 3.87–5.11)
RDW: 13 % (ref 11.5–15.5)
WBC: 6.9 10*3/uL (ref 4.0–10.5)

## 2014-10-27 LAB — URINE MICROSCOPIC-ADD ON

## 2014-10-27 LAB — URINALYSIS, ROUTINE W REFLEX MICROSCOPIC
BILIRUBIN URINE: NEGATIVE
Glucose, UA: NEGATIVE mg/dL
Hgb urine dipstick: NEGATIVE
Ketones, ur: NEGATIVE mg/dL
NITRITE: NEGATIVE
Protein, ur: NEGATIVE mg/dL
Specific Gravity, Urine: 1.02 (ref 1.005–1.030)
UROBILINOGEN UA: 0.2 mg/dL (ref 0.0–1.0)
pH: 6 (ref 5.0–8.0)

## 2014-10-27 LAB — SURGICAL PCR SCREEN
MRSA, PCR: NEGATIVE
STAPHYLOCOCCUS AUREUS: POSITIVE — AB

## 2014-10-27 LAB — COMPREHENSIVE METABOLIC PANEL
ALT: 14 U/L (ref 0–35)
AST: 23 U/L (ref 0–37)
Albumin: 3.9 g/dL (ref 3.5–5.2)
Alkaline Phosphatase: 72 U/L (ref 39–117)
Anion gap: 8 (ref 5–15)
BUN: 10 mg/dL (ref 6–23)
CO2: 26 mmol/L (ref 19–32)
Calcium: 9.4 mg/dL (ref 8.4–10.5)
Chloride: 106 mEq/L (ref 96–112)
Creatinine, Ser: 0.93 mg/dL (ref 0.50–1.10)
GFR calc Af Amer: 80 mL/min — ABNORMAL LOW (ref >90)
GFR calc non Af Amer: 69 mL/min — ABNORMAL LOW (ref >90)
Glucose, Bld: 103 mg/dL — ABNORMAL HIGH (ref 70–99)
Potassium: 3.8 mmol/L (ref 3.5–5.1)
Sodium: 140 mmol/L (ref 135–145)
Total Bilirubin: 0.4 mg/dL (ref 0.3–1.2)
Total Protein: 7 g/dL (ref 6.0–8.3)

## 2014-10-27 LAB — TYPE AND SCREEN
ABO/RH(D): A POS
Antibody Screen: NEGATIVE

## 2014-10-27 LAB — ABO/RH: ABO/RH(D): A POS

## 2014-10-27 LAB — PROTIME-INR
INR: 1.51 — AB (ref 0.00–1.49)
Prothrombin Time: 18.3 seconds — ABNORMAL HIGH (ref 11.6–15.2)

## 2014-10-27 LAB — APTT: APTT: 33 s (ref 24–37)

## 2014-10-27 NOTE — Progress Notes (Signed)
Nurse called [facility] and called patient and informed patient of positive PCR results. Patient verbalized understanding.

## 2014-10-27 NOTE — Progress Notes (Signed)
Patient informed Nurse that she had a Medtronic pacemaker and her Cardiologist Dr. Wendie Chess at [facility] handles it. Perioperative pacemaker programming device sheet sent to Cardiologist office. Confirmation fax placed on chart. Patient denied having any acute cardiac or pulmonary issues. Nurse asked patient about Xarelto and patient stated she was instructed to stop taking it 48 hours before surgery on 11/07/14.

## 2014-10-29 LAB — URINE CULTURE

## 2014-10-31 NOTE — Progress Notes (Addendum)
Anesthesia Chart Review:  Patient is a 53 year old female scheduled for right TKR on 11/09/14 by Dr. Kathryne Hitch. Case is posted for general anesthesia.  History includes former smoker, OSA, HTN, chronic afib s/p ablation, CHB s/p Medtronic Syncra CRT-P PPM (originally placed in 2001; last generator change 06/2010; followed by Dr. Wendie Chess with [facility] in [place]), anxiety, GERD, hypothyroidism, DM2, arthritis, hysterectomy, gastric sleeve resection with hiatal hernia repair, cholecystectomy. BMI is consistent with obesity. She reports > 100 lb weight loss over the past several years.  PCP is listed as Edmonia James, PA-C. Last office visit with Dr. Georg Ruddle was 03/08/14 with 12 month physician follow-up, device clinic 3 months.  Meds include Dilaudid, amlodipine, Flexeril, gabapentin,  levothyroxine, omeprazole, Xarelto, tramadol, Effexor.  Preoperative labs noted. Glucose 103.  Her A1C on 10/17/14 was 5.7 ([facility] Every[facility]). PT/INR 18./1.51.  PTT 33.  She is holding Xarelto 48 hours prior to surgery.  Urine culture showed >= 100,000 colonies, but none predominant.  Defer decision to repeat UA/C&S to the surgeon.  10/27/14 EKG:  V-paced rhythm with underlying atrial flutter. No significant change since 03/08/08 ([facility]). Also had afib/flutter pattern on 03/08/14 EKG from Dr. Ignacia Marvel office. Patient with known chronic afib.   Echo 02/10/14 ([facility]): LV normal is normal in size, wall thickness, and wall motion. LVEF 60-65%. Interatrial septum is intact. AV is trileaflet with thin, pliable leaflet that move normally. Unable to adequately determine diastolic dysfunction. LA is severely dilated. Pacemaker lead is in the RV. Moderate 2+ TR. Mildly dilated RA. RVSP is elevated between 40-50 mm Hg, consistent with moderate pulmonary hypertension.  Dr. Ignacia Marvel office completed his own perioperative PPM management form.  Magnet can be used to prevent inhibition. Transcutaneous pads should be  available in the OR. Check ECG post-operatively and if rhythm is unchanged then contact [facility] post-operatively. She is paced 97.7% of the time.  I called and spoke with patient who confirmed that she was told by Dr. Georg Ruddle to hold Xarelto 48 hours prior to surgery. As above she has known chronic afib/flutter. She is asymptomatic. If no acute changes then I would anticipate that she could proceed as planned.  George Hugh [facility] Short Stay Center/Anesthesiology Phone [phone] 11/01/2014 2:31 PM

## 2014-11-08 MED ORDER — CHLORHEXIDINE GLUCONATE 4 % EX LIQD
60.0000 mL | Freq: Once | CUTANEOUS | Status: DC
  Filled 2014-11-08: qty 60

## 2014-11-08 MED ORDER — CEFAZOLIN SODIUM-DEXTROSE 2-3 GM-% IV SOLR
2.0000 g | INTRAVENOUS | Status: AC
  Administered 2014-11-09: 2 g via INTRAVENOUS
  Filled 2014-11-08: qty 50

## 2014-11-08 MED ORDER — LACTATED RINGERS IV SOLN
INTRAVENOUS | Status: DC
  Administered 2014-11-09: 09:00:00 via INTRAVENOUS

## 2014-11-08 NOTE — Progress Notes (Signed)
Patient made aware of surgery time change, arrival time is 0800 AM.

## 2014-11-09 ENCOUNTER — Encounter (HOSPITAL_COMMUNITY): Payer: Self-pay | Admitting: *Deleted

## 2014-11-09 ENCOUNTER — Inpatient Hospital Stay (HOSPITAL_COMMUNITY): Payer: PRIVATE HEALTH INSURANCE | Admitting: Anesthesiology

## 2014-11-09 ENCOUNTER — Inpatient Hospital Stay (HOSPITAL_COMMUNITY)
Admission: RE | Admit: 2014-11-09 | Discharge: 2014-11-13 | DRG: 470 | Disposition: A | Discharge status: Home Health | Payer: PRIVATE HEALTH INSURANCE | Source: Ambulatory Visit | Attending: Orthopedic Surgery | Admitting: Orthopedic Surgery

## 2014-11-09 ENCOUNTER — Inpatient Hospital Stay (HOSPITAL_COMMUNITY): Payer: PRIVATE HEALTH INSURANCE | Admitting: Vascular Surgery

## 2014-11-09 ENCOUNTER — Inpatient Hospital Stay (HOSPITAL_COMMUNITY): Payer: PRIVATE HEALTH INSURANCE

## 2014-11-09 ENCOUNTER — Encounter (HOSPITAL_COMMUNITY)
Admission: RE | Disposition: A | Discharge status: Home Health | Payer: Self-pay | Source: Ambulatory Visit | Attending: Orthopedic Surgery

## 2014-11-09 DIAGNOSIS — D62 Acute posthemorrhagic anemia: Secondary | ICD-10-CM | POA: Diagnosis not present

## 2014-11-09 DIAGNOSIS — Z6836 Body mass index (BMI) 36.0-36.9, adult: Secondary | ICD-10-CM | POA: Diagnosis not present

## 2014-11-09 DIAGNOSIS — F419 Anxiety disorder, unspecified: Secondary | ICD-10-CM | POA: Diagnosis present

## 2014-11-09 DIAGNOSIS — E119 Type 2 diabetes mellitus without complications: Secondary | ICD-10-CM | POA: Diagnosis present

## 2014-11-09 DIAGNOSIS — M1711 Unilateral primary osteoarthritis, right knee: Principal | ICD-10-CM | POA: Diagnosis present

## 2014-11-09 DIAGNOSIS — Z96659 Presence of unspecified artificial knee joint: Secondary | ICD-10-CM

## 2014-11-09 DIAGNOSIS — G4730 Sleep apnea, unspecified: Secondary | ICD-10-CM | POA: Diagnosis present

## 2014-11-09 DIAGNOSIS — Z7901 Long term (current) use of anticoagulants: Secondary | ICD-10-CM | POA: Diagnosis not present

## 2014-11-09 DIAGNOSIS — M25561 Pain in right knee: Secondary | ICD-10-CM | POA: Diagnosis present

## 2014-11-09 DIAGNOSIS — R110 Nausea: Secondary | ICD-10-CM | POA: Diagnosis not present

## 2014-11-09 DIAGNOSIS — Z79891 Long term (current) use of opiate analgesic: Secondary | ICD-10-CM | POA: Diagnosis not present

## 2014-11-09 DIAGNOSIS — E6609 Other obesity due to excess calories: Secondary | ICD-10-CM | POA: Diagnosis present

## 2014-11-09 DIAGNOSIS — K219 Gastro-esophageal reflux disease without esophagitis: Secondary | ICD-10-CM | POA: Diagnosis present

## 2014-11-09 DIAGNOSIS — I10 Essential (primary) hypertension: Secondary | ICD-10-CM | POA: Diagnosis present

## 2014-11-09 DIAGNOSIS — Z950 Presence of cardiac pacemaker: Secondary | ICD-10-CM

## 2014-11-09 DIAGNOSIS — M179 Osteoarthritis of knee, unspecified: Secondary | ICD-10-CM | POA: Diagnosis present

## 2014-11-09 DIAGNOSIS — Z87891 Personal history of nicotine dependence: Secondary | ICD-10-CM

## 2014-11-09 DIAGNOSIS — E039 Hypothyroidism, unspecified: Secondary | ICD-10-CM | POA: Diagnosis present

## 2014-11-09 DIAGNOSIS — M1710 Unilateral primary osteoarthritis, unspecified knee: Secondary | ICD-10-CM | POA: Diagnosis present

## 2014-11-09 DIAGNOSIS — Z79899 Other long term (current) drug therapy: Secondary | ICD-10-CM

## 2014-11-09 HISTORY — PX: TOTAL KNEE ARTHROPLASTY: SHX125

## 2014-11-09 LAB — GLUCOSE, CAPILLARY: GLUCOSE-CAPILLARY: 92 mg/dL (ref 70–99)

## 2014-11-09 SURGERY — ARTHROPLASTY, KNEE, TOTAL
Anesthesia: General | Site: Knee | Laterality: Right

## 2014-11-09 MED ORDER — PROPOFOL 10 MG/ML IV BOLUS
INTRAVENOUS | Status: AC
  Filled 2014-11-09: qty 20

## 2014-11-09 MED ORDER — METOCLOPRAMIDE HCL 5 MG/ML IJ SOLN: 5.0000 mg | Freq: Three times a day (TID) | INTRAMUSCULAR | Status: DC | PRN

## 2014-11-09 MED ORDER — PANTOPRAZOLE SODIUM 40 MG PO TBEC
80.0000 mg | DELAYED_RELEASE_TABLET | Freq: Every day | ORAL | Status: DC
  Administered 2014-11-09 – 2014-11-13 (×5): 80 mg via ORAL
  Filled 2014-11-09 (×4): qty 2

## 2014-11-09 MED ORDER — GABAPENTIN 300 MG PO CAPS
300.0000 mg | ORAL_CAPSULE | Freq: Every day | ORAL | Status: DC
  Administered 2014-11-09 – 2014-11-12 (×4): 300 mg via ORAL
  Filled 2014-11-09 (×5): qty 1

## 2014-11-09 MED ORDER — ONDANSETRON HCL 4 MG/2ML IJ SOLN: 4.0000 mg | Freq: Once | INTRAMUSCULAR | Status: DC | PRN

## 2014-11-09 MED ORDER — HYDROMORPHONE HCL 2 MG PO TABS: ORAL_TABLET | ORAL | Status: DC

## 2014-11-09 MED ORDER — CEFAZOLIN SODIUM-DEXTROSE 2-3 GM-% IV SOLR
INTRAVENOUS | Status: AC
  Filled 2014-11-09: qty 50

## 2014-11-09 MED ORDER — LIDOCAINE HCL (CARDIAC) 20 MG/ML IV SOLN
INTRAVENOUS | Status: DC | PRN
  Administered 2014-11-09: 40 mg via INTRAVENOUS

## 2014-11-09 MED ORDER — PROPOFOL 10 MG/ML IV BOLUS
INTRAVENOUS | Status: DC | PRN
  Administered 2014-11-09: 170 mg via INTRAVENOUS

## 2014-11-09 MED ORDER — FENTANYL CITRATE 0.05 MG/ML IJ SOLN
INTRAMUSCULAR | Status: AC
  Filled 2014-11-09: qty 5

## 2014-11-09 MED ORDER — DEXAMETHASONE SODIUM PHOSPHATE 4 MG/ML IJ SOLN
INTRAMUSCULAR | Status: AC
  Filled 2014-11-09: qty 2

## 2014-11-09 MED ORDER — CEFAZOLIN SODIUM-DEXTROSE 2-3 GM-% IV SOLR
2.0000 g | Freq: Four times a day (QID) | INTRAVENOUS | Status: AC
  Administered 2014-11-09 (×2): 2 g via INTRAVENOUS
  Filled 2014-11-09: qty 50

## 2014-11-09 MED ORDER — 0.9 % SODIUM CHLORIDE (POUR BTL) OPTIME
TOPICAL | Status: DC | PRN
  Administered 2014-11-09: 1000 mL

## 2014-11-09 MED ORDER — ONDANSETRON HCL 4 MG PO TABS: 4.0000 mg | ORAL_TABLET | Freq: Three times a day (TID) | ORAL | Status: AC | PRN

## 2014-11-09 MED ORDER — LIDOCAINE HCL (CARDIAC) 20 MG/ML IV SOLN
INTRAVENOUS | Status: AC
  Filled 2014-11-09: qty 5

## 2014-11-09 MED ORDER — METOCLOPRAMIDE HCL 10 MG PO TABS: 5.0000 mg | ORAL_TABLET | Freq: Three times a day (TID) | ORAL | Status: DC | PRN

## 2014-11-09 MED ORDER — ACETAMINOPHEN 10 MG/ML IV SOLN
INTRAVENOUS | Status: DC | PRN
  Administered 2014-11-09: 1000 mg via INTRAVENOUS

## 2014-11-09 MED ORDER — POTASSIUM CHLORIDE IN NACL 20-0.9 MEQ/L-% IV SOLN
INTRAVENOUS | Status: DC
  Administered 2014-11-09: 100 mL/h via INTRAVENOUS
  Administered 2014-11-10 – 2014-11-12 (×3): via INTRAVENOUS
  Filled 2014-11-09 (×13): qty 1000

## 2014-11-09 MED ORDER — AMLODIPINE BESYLATE 5 MG PO TABS
5.0000 mg | ORAL_TABLET | Freq: Every day | ORAL | Status: DC
  Administered 2014-11-09 – 2014-11-12 (×4): 5 mg via ORAL
  Filled 2014-11-09 (×5): qty 1

## 2014-11-09 MED ORDER — EPHEDRINE SULFATE 50 MG/ML IJ SOLN
INTRAMUSCULAR | Status: DC | PRN
  Administered 2014-11-09: 10 mg via INTRAVENOUS

## 2014-11-09 MED ORDER — SODIUM CHLORIDE 0.9 % IJ SOLN
INTRAMUSCULAR | Status: DC | PRN
  Administered 2014-11-09: 40 mL

## 2014-11-09 MED ORDER — BUPIVACAINE HCL (PF) 0.25 % IJ SOLN
INTRAMUSCULAR | Status: DC | PRN
  Administered 2014-11-09: 10 mL

## 2014-11-09 MED ORDER — INSULIN ASPART 100 UNIT/ML ~~LOC~~ SOLN: 0.0000 [IU] | Freq: Three times a day (TID) | SUBCUTANEOUS | Status: DC

## 2014-11-09 MED ORDER — FENTANYL CITRATE 0.05 MG/ML IJ SOLN
25.0000 ug | INTRAMUSCULAR | Status: DC | PRN
  Administered 2014-11-09: 25 ug via INTRAVENOUS

## 2014-11-09 MED ORDER — FENTANYL CITRATE 0.05 MG/ML IJ SOLN
INTRAMUSCULAR | Status: AC
  Filled 2014-11-09: qty 2

## 2014-11-09 MED ORDER — DIPHENHYDRAMINE HCL 12.5 MG/5ML PO ELIX: 12.5000 mg | ORAL_SOLUTION | ORAL | Status: DC | PRN

## 2014-11-09 MED ORDER — SODIUM CHLORIDE 0.9 % IJ SOLN
INTRAMUSCULAR | Status: AC
  Filled 2014-11-09: qty 10

## 2014-11-09 MED ORDER — SODIUM CHLORIDE 0.9 % IR SOLN
Status: DC | PRN
  Administered 2014-11-09: 1000 mL

## 2014-11-09 MED ORDER — ONDANSETRON HCL 4 MG/2ML IJ SOLN
INTRAMUSCULAR | Status: AC
  Filled 2014-11-09: qty 2

## 2014-11-09 MED ORDER — PHENOL 1.4 % MT LIQD: 1.0000 | OROMUCOSAL | Status: DC | PRN

## 2014-11-09 MED ORDER — CYCLOBENZAPRINE HCL 10 MG PO TABS
10.0000 mg | ORAL_TABLET | Freq: Three times a day (TID) | ORAL | Status: DC
  Administered 2014-11-09 – 2014-11-13 (×10): 10 mg via ORAL
  Filled 2014-11-09 (×16): qty 1

## 2014-11-09 MED ORDER — VENLAFAXINE HCL ER 75 MG PO CP24
225.0000 mg | ORAL_CAPSULE | Freq: Every day | ORAL | Status: DC
  Administered 2014-11-09 – 2014-11-12 (×4): 225 mg via ORAL
  Filled 2014-11-09 (×5): qty 1

## 2014-11-09 MED ORDER — BUPIVACAINE LIPOSOME 1.3 % IJ SUSP
20.0000 mL | INTRAMUSCULAR | Status: AC
  Administered 2014-11-09: 20 mL
  Filled 2014-11-09: qty 20

## 2014-11-09 MED ORDER — MIDAZOLAM HCL 2 MG/2ML IJ SOLN: 1.0000 mg | INTRAMUSCULAR | Status: DC | PRN

## 2014-11-09 MED ORDER — CYCLOBENZAPRINE HCL 10 MG PO TABS
10.0000 mg | ORAL_TABLET | Freq: Three times a day (TID) | ORAL | Status: DC
  Filled 2014-11-09: qty 1

## 2014-11-09 MED ORDER — ONDANSETRON HCL 4 MG/2ML IJ SOLN
INTRAMUSCULAR | Status: DC | PRN
  Administered 2014-11-09: 4 mg via INTRAVENOUS

## 2014-11-09 MED ORDER — MIDAZOLAM HCL 2 MG/2ML IJ SOLN
INTRAMUSCULAR | Status: AC
Start: 2014-11-09 — End: 2014-11-09
  Filled 2014-11-09: qty 2

## 2014-11-09 MED ORDER — MENTHOL 3 MG MT LOZG: 1.0000 | LOZENGE | OROMUCOSAL | Status: DC | PRN

## 2014-11-09 MED ORDER — DEXAMETHASONE SODIUM PHOSPHATE 4 MG/ML IJ SOLN
INTRAMUSCULAR | Status: DC | PRN
  Administered 2014-11-09: 8 mg via INTRAVENOUS

## 2014-11-09 MED ORDER — RIVAROXABAN 20 MG PO TABS
20.0000 mg | ORAL_TABLET | Freq: Every day | ORAL | Status: DC
  Administered 2014-11-10 – 2014-11-12 (×3): 20 mg via ORAL
  Filled 2014-11-09 (×4): qty 1

## 2014-11-09 MED ORDER — ONDANSETRON HCL 4 MG/2ML IJ SOLN
4.0000 mg | Freq: Four times a day (QID) | INTRAMUSCULAR | Status: DC | PRN
  Administered 2014-11-10 – 2014-11-11 (×2): 4 mg via INTRAVENOUS
  Filled 2014-11-09 (×2): qty 2

## 2014-11-09 MED ORDER — LACTATED RINGERS IV SOLN
INTRAVENOUS | Status: DC | PRN
  Administered 2014-11-09 (×2): via INTRAVENOUS

## 2014-11-09 MED ORDER — MIDAZOLAM HCL 2 MG/2ML IJ SOLN
INTRAMUSCULAR | Status: AC
  Filled 2014-11-09: qty 2

## 2014-11-09 MED ORDER — BISACODYL 5 MG PO TBEC: 5.0000 mg | DELAYED_RELEASE_TABLET | Freq: Every day | ORAL | Status: AC | PRN

## 2014-11-09 MED ORDER — BISACODYL 5 MG PO TBEC
5.0000 mg | DELAYED_RELEASE_TABLET | Freq: Every day | ORAL | Status: DC | PRN
Start: 2014-11-09 — End: 2014-11-13

## 2014-11-09 MED ORDER — ONDANSETRON HCL 4 MG PO TABS
4.0000 mg | ORAL_TABLET | Freq: Four times a day (QID) | ORAL | Status: DC | PRN
  Administered 2014-11-09 – 2014-11-11 (×3): 4 mg via ORAL
  Filled 2014-11-09 (×3): qty 1

## 2014-11-09 MED ORDER — HYDROMORPHONE HCL 1 MG/ML IJ SOLN
0.5000 mg | INTRAMUSCULAR | Status: DC | PRN
  Administered 2014-11-10 – 2014-11-12 (×8): 1 mg via INTRAVENOUS
  Filled 2014-11-09 (×8): qty 1

## 2014-11-09 MED ORDER — EPHEDRINE SULFATE 50 MG/ML IJ SOLN
INTRAMUSCULAR | Status: AC
  Filled 2014-11-09: qty 1

## 2014-11-09 MED ORDER — BUPIVACAINE HCL (PF) 0.25 % IJ SOLN
INTRAMUSCULAR | Status: AC
Start: 2014-11-09 — End: 2014-11-09
  Filled 2014-11-09: qty 30

## 2014-11-09 MED ORDER — ACETAMINOPHEN 10 MG/ML IV SOLN
INTRAVENOUS | Status: AC
  Filled 2014-11-09: qty 100

## 2014-11-09 MED ORDER — MIDAZOLAM HCL 5 MG/5ML IJ SOLN
INTRAMUSCULAR | Status: DC | PRN
  Administered 2014-11-09: 2 mg via INTRAVENOUS

## 2014-11-09 MED ORDER — DOCUSATE SODIUM 100 MG PO CAPS
100.0000 mg | ORAL_CAPSULE | Freq: Two times a day (BID) | ORAL | Status: DC
  Administered 2014-11-09 – 2014-11-13 (×8): 100 mg via ORAL
  Filled 2014-11-09 (×10): qty 1

## 2014-11-09 MED ORDER — FENTANYL CITRATE 0.05 MG/ML IJ SOLN
INTRAMUSCULAR | Status: DC | PRN
  Administered 2014-11-09: 100 ug via INTRAVENOUS
  Administered 2014-11-09 (×2): 25 ug via INTRAVENOUS

## 2014-11-09 MED ORDER — LEVOTHYROXINE SODIUM 100 MCG PO TABS
100.0000 ug | ORAL_TABLET | Freq: Every day | ORAL | Status: DC
  Administered 2014-11-10 – 2014-11-11 (×3): 100 ug via ORAL
  Filled 2014-11-09 (×5): qty 1

## 2014-11-09 MED ORDER — MORPHINE SULFATE 15 MG PO TABS
15.0000 mg | ORAL_TABLET | ORAL | Status: DC | PRN
  Administered 2014-11-09 – 2014-11-13 (×8): 15 mg via ORAL
  Filled 2014-11-09 (×8): qty 1

## 2014-11-09 MED ORDER — ACETAMINOPHEN 500 MG PO TABS
1000.0000 mg | ORAL_TABLET | Freq: Four times a day (QID) | ORAL | Status: AC
  Administered 2014-11-09 – 2014-11-10 (×4): 1000 mg via ORAL
  Filled 2014-11-09 (×4): qty 2

## 2014-11-09 SURGICAL SUPPLY — 63 items
APL SKNCLS STERI-STRIP NONHPOA (GAUZE/BANDAGES/DRESSINGS) ×1
BANDAGE ELASTIC 4 VELCRO ST LF (GAUZE/BANDAGES/DRESSINGS) ×2 IMPLANT
BANDAGE ELASTIC 6 VELCRO ST LF (GAUZE/BANDAGES/DRESSINGS) ×2 IMPLANT
BANDAGE ESMARK 6X9 LF (GAUZE/BANDAGES/DRESSINGS) ×1 IMPLANT
BENZOIN TINCTURE PRP APPL 2/3 (GAUZE/BANDAGES/DRESSINGS) ×2 IMPLANT
BLADE SAG 18X100X1.27 (BLADE) ×6 IMPLANT
BNDG CMPR 9X6 STRL LF SNTH (GAUZE/BANDAGES/DRESSINGS) ×1
BNDG ESMARK 6X9 LF (GAUZE/BANDAGES/DRESSINGS) ×3
BOWL SMART MIX CTS (DISPOSABLE) ×3 IMPLANT
CAPT KNEE TOTAL 3 ×2 IMPLANT
CEMENT BONE SIMPLEX SPEEDSET (Cement) ×6 IMPLANT
CLOSURE WOUND 1/2 X4 (GAUZE/BANDAGES/DRESSINGS) ×1
COVER SURGICAL LIGHT HANDLE (MISCELLANEOUS) ×3 IMPLANT
CUFF TOURNIQUET SINGLE 34IN LL (TOURNIQUET CUFF) ×3 IMPLANT
DRAPE EXTREMITY T 121X128X90 (DRAPE) ×3 IMPLANT
DRAPE PROXIMA HALF (DRAPES) ×3 IMPLANT
DRAPE U-SHAPE 47X51 STRL (DRAPES) ×3 IMPLANT
DURAPREP 26ML APPLICATOR (WOUND CARE) ×6 IMPLANT
ELECT CAUTERY BLADE 6.4 (BLADE) ×5 IMPLANT
ELECT REM PT RETURN 9FT ADLT (ELECTROSURGICAL) ×3
ELECTRODE REM PT RTRN 9FT ADLT (ELECTROSURGICAL) ×1 IMPLANT
EVACUATOR 1/8 PVC DRAIN (DRAIN) ×3 IMPLANT
FACESHIELD WRAPAROUND (MASK) ×6 IMPLANT
FACESHIELD WRAPAROUND OR TEAM (MASK) ×2 IMPLANT
GAUZE SPONGE 4X4 12PLY STRL (GAUZE/BANDAGES/DRESSINGS) ×2 IMPLANT
GLOVE BIOGEL PI IND STRL 7.0 (GLOVE) ×2 IMPLANT
GLOVE BIOGEL PI INDICATOR 7.0 (GLOVE) ×2
GOWN STRL REUS W/ TWL LRG LVL3 (GOWN DISPOSABLE) ×1 IMPLANT
GOWN STRL REUS W/ TWL XL LVL3 (GOWN DISPOSABLE) ×1 IMPLANT
GOWN STRL REUS W/TWL LRG LVL3 (GOWN DISPOSABLE) ×6
GOWN STRL REUS W/TWL XL LVL3 (GOWN DISPOSABLE) ×6
HANDPIECE INTERPULSE COAX TIP (DISPOSABLE) ×3
IMMOBILIZER KNEE 22 UNIV (SOFTGOODS) ×2 IMPLANT
KIT BASIN OR (CUSTOM PROCEDURE TRAY) ×3 IMPLANT
KIT ROOM TURNOVER OR (KITS) ×3 IMPLANT
MANIFOLD NEPTUNE II (INSTRUMENTS) ×3 IMPLANT
NDL 18GX1X1/2 (RX/OR ONLY) (NEEDLE) ×1 IMPLANT
NDL HYPO 25GX1X1/2 BEV (NEEDLE) ×1 IMPLANT
NEEDLE 18GX1X1/2 (RX/OR ONLY) (NEEDLE) ×3 IMPLANT
NEEDLE HYPO 25GX1X1/2 BEV (NEEDLE) ×3 IMPLANT
NS IRRIG 1000ML POUR BTL (IV SOLUTION) ×3 IMPLANT
PACK TOTAL JOINT (CUSTOM PROCEDURE TRAY) ×3 IMPLANT
PACK UNIVERSAL I (CUSTOM PROCEDURE TRAY) ×3 IMPLANT
PAD ARMBOARD 7.5X6 YLW CONV (MISCELLANEOUS) ×6 IMPLANT
PADDING CAST ABS 4INX4YD NS (CAST SUPPLIES) ×2
PADDING CAST ABS 6INX4YD NS (CAST SUPPLIES) ×2
PADDING CAST ABS COTTON 4X4 ST (CAST SUPPLIES) IMPLANT
PADDING CAST ABS COTTON 6X4 NS (CAST SUPPLIES) IMPLANT
SET HNDPC FAN SPRY TIP SCT (DISPOSABLE) ×1 IMPLANT
STRIP CLOSURE SKIN 1/2X4 (GAUZE/BANDAGES/DRESSINGS) ×1 IMPLANT
SUCTION FRAZIER TIP 10 FR DISP (SUCTIONS) ×3 IMPLANT
SUT MNCRL AB 4-0 PS2 18 (SUTURE) ×3 IMPLANT
SUT VIC AB 0 CT1 27 (SUTURE)
SUT VIC AB 0 CT1 27XBRD ANBCTR (SUTURE) IMPLANT
SUT VIC AB 1 CT1 27 (SUTURE) ×9
SUT VIC AB 1 CT1 27XBRD ANBCTR (SUTURE) ×2 IMPLANT
SUT VIC AB 2-0 CT1 27 (SUTURE) ×9
SUT VIC AB 2-0 CT1 TAPERPNT 27 (SUTURE) ×2 IMPLANT
SYR 50ML LL SCALE MARK (SYRINGE) ×3 IMPLANT
SYR CONTROL 10ML LL (SYRINGE) ×3 IMPLANT
TOWEL OR 17X24 6PK STRL BLUE (TOWEL DISPOSABLE) ×3 IMPLANT
TOWEL OR 17X26 10 PK STRL BLUE (TOWEL DISPOSABLE) ×3 IMPLANT
WATER STERILE IRR 1000ML POUR (IV SOLUTION) ×2 IMPLANT

## 2014-11-09 NOTE — Transfer of Care (Signed)
Immediate Anesthesia Transfer of Care Note  Patient: Suzanne Bryan  Procedure(s) Performed: Procedure(s): RIGHT TOTAL KNEE ARTHROPLASTY (Right)  Patient Location: PACU  Anesthesia Type:General  Level of Consciousness: awake, oriented and confused  Airway & Oxygen Therapy: Patient Spontanous Breathing and Patient connected to nasal cannula oxygen  Post-op Assessment: Report given to PACU RN and Post -op Vital signs reviewed and stable  Post vital signs: Reviewed and stable  Complications: No apparent anesthesia complications

## 2014-11-09 NOTE — Anesthesia Preprocedure Evaluation (Addendum)
Anesthesia Evaluation  Patient identified by MRN, date of birth, ID band Patient awake    Reviewed: Allergy & Precautions, NPO status , Patient's Chart, lab work & pertinent test results  Airway Mallampati: II  TM Distance: >3 FB Neck ROM: Full    Dental  (+) Teeth Intact, Dental Advisory Given   Pulmonary sleep apnea and Continuous Positive Airway Pressure Ventilation , former smoker,  breath sounds clear to auscultation        Cardiovascular hypertension, Pt. on medications + pacemaker Rhythm:Regular Rate:Normal     Neuro/Psych Anxiety negative neurological ROS     GI/Hepatic Neg liver ROS, GERD-  Medicated and Controlled,  Endo/Other  diabetes, Well Controlled, Type 2Hypothyroidism   Renal/GU negative Renal ROS     Musculoskeletal   Abdominal (+) + obese,   Peds  Hematology   Anesthesia Other Findings   Reproductive/Obstetrics                           Anesthesia Physical Anesthesia Plan  ASA: III  Anesthesia Plan: General   Post-op Pain Management:    Induction: Intravenous  Airway Management Planned: Oral ETT  Additional Equipment:   Intra-op Plan:   Post-operative Plan: Extubation in OR  Informed Consent: I have reviewed the patients History and Physical, chart, labs and discussed the procedure including the risks, benefits and alternatives for the proposed anesthesia with the patient or authorized representative who has indicated his/her understanding and acceptance.   Dental advisory given  Plan Discussed with: CRNA and Anesthesiologist  Anesthesia Plan Comments: (DJD R. Knee Chronic atrial Fib with complete heart block, pacer dependent Hypertension Anxiety  Suzanne Bryan)        Anesthesia Quick Evaluation

## 2014-11-09 NOTE — Discharge Instructions (Signed)
Total Knee Replacement, Care After Refer to this sheet in the next few weeks. These instructions provide you with information on caring for yourself after your procedure. Your health care provider also may give you specific instructions. Your treatment has been planned according to the most current medical practices, but problems sometimes occur. Call your health care provider if you have any problems or questions after your procedure. HOME CARE INSTRUCTIONS   Weight bearing as tolerated.  Resume Xarelto following surgery.  Change bandage daily starting on Saturday.  May shower on Monday, but do not soak incision. May apply ice for up to 20 minutes at a time for pain and swelling.  Follow up appointment in two weeks.    See a physical therapist as directed by your health care provider.  Take medicines only as directed by your health care provider.  Avoid lifting or driving until you are instructed otherwise.  If you have been sent home with a continuous passive motion machine, use it as directed by your health care provider. SEEK MEDICAL CARE IF:  You have difficulty breathing.  You have drainage, redness, swelling, or pain at your incision site.  You have a bad smell coming from your incision site.  You have persistent bleeding from your incision site.  Your incision breaks open after sutures (stitches) or staples have been removed.  You have a fever. SEEK IMMEDIATE MEDICAL CARE IF:   You have a rash.  You have pain or swelling in your calf or thigh.  You have shortness of breath or chest pain.  Your range of motion in your knee is decreasing rather than increasing. MAKE SURE YOU:   Understand these instructions.  Will watch your condition.  Will get help right away if you are not doing well or get worse. Document Released: 05/10/2005 Document Revised: 03/07/2014 Document Reviewed: 12/10/2011 [facility] Patient Information 2015 [place], [place]. This information is not  intended to replace advice given to you by your health care provider. Make sure you discuss any questions you have with your health care provider.
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
## 2014-11-09 NOTE — Progress Notes (Signed)
Pt refuses cpap at this time, states that she would rather wear the oxygen. instructed to notify RT if she changes her mind.

## 2014-11-09 NOTE — Progress Notes (Signed)
Orthopedic Tech Progress Note Patient Details:  Suzanne Bryan 09/10/1961 [id number]  CPM Right Knee CPM Right Knee: On Right Knee Flexion (Degrees): 90 Right Knee Extension (Degrees): 0 Trapeze bar patient helper will be provided when one becomes available; RN notified  Hildred Priest 11/09/2014, 1:14 PM

## 2014-11-09 NOTE — Interval H&P Note (Signed)
History and Physical Interval Note:  11/09/2014 8:32 AM  Suzanne Bryan  has presented today for surgery, with the diagnosis of OA RIGHT KNEE  The various methods of treatment have been discussed with the patient and family. After consideration of risks, benefits and other options for treatment, the patient has consented to  Procedure(s): RIGHT TOTAL KNEE ARTHROPLASTY (Right) as a surgical intervention .  The patient's history has been reviewed, patient examined, no change in status, stable for surgery.  I have reviewed the patient's chart and labs.  Questions were answered to the patient's satisfaction.     Allanna Bresee F

## 2014-11-09 NOTE — Progress Notes (Signed)
Orthopedic Tech Progress Note Patient Details:  Suzanne Bryan [birth year] [id number]  Patient ID: [name], female   DOB: April 18, 1961, 54 y.o.   MRN: [id number] Footsie roll  Hildred Priest 11/09/2014, 1:15 PM

## 2014-11-09 NOTE — Discharge Summary (Addendum)
Patient ID: YUETTE Bryan MRN: [id number] DOB/AGE: May 14, 1961 54 y.o.  Admit date: 11/09/2014 Discharge date: 11/11/2014  Admission Diagnoses:  Active Problems:   DJD (degenerative joint disease) of knee   Discharge Diagnoses:  Same  Past Medical History  Diagnosis Date  . Sleep apnea     Wears CPAP nightly  . Hypertension   . Atrial fibrillation   . Anxiety   . Diabetes mellitus without complication   . Hypothyroidism   . GERD (gastroesophageal reflux disease)   . Arthritis   . Presence of permanent cardiac pacemaker     medtronic    Surgeries: Procedure(s): RIGHT TOTAL KNEE ARTHROPLASTY on 11/09/2014   Consultants:    Discharged Condition: Improved  Hospital Course: Suzanne Bryan is an 54 y.o. female who was admitted 11/09/2014 for operative treatment of primary osteoarthritis of the right knee. Patient has severe unremitting pain that affects sleep, daily activities, and work/hobbies. After pre-op clearance the patient was taken to the operating room on 11/09/2014 and underwent  Procedure(s): RIGHT TOTAL KNEE ARTHROPLASTY.  Patient with a pre-op Hb of 12.8 developed ABLA on pod#1 with a Hb of 9.8 and 9.3 on pod#2.  She is currently asymptomatic but we will continue to follow.    Patient was given perioperative antibiotics:      Anti-infectives    Start     Dose/Rate Route Frequency Ordered Stop   11/09/14 1800  ceFAZolin (ANCEF) IVPB 2 g/50 mL premix     2 g100 mL/hr over 30 Minutes Intravenous 4 times per day 11/09/14 1506 11/09/14 2324   11/09/14 1457  ceFAZolin (ANCEF) 2-3 GM-% IVPB SOLR    Comments:  Reynolds, Lauren   : cabinet override      11/09/14 1457 11/10/14 0314   11/09/14 0600  ceFAZolin (ANCEF) IVPB 2 g/50 mL premix     2 g100 mL/hr over 30 Minutes Intravenous On call to O.R. 11/08/14 1403 11/09/14 1000       Patient was given sequential compression devices, early ambulation, and chemoprophylaxis to prevent DVT.  Patient benefited maximally from  hospital stay and there were no complications.    Recent vital signs:  Patient Vitals for the past 24 hrs:  BP Temp Pulse Resp SpO2  11/11/14 0537 (!) 116/58 mmHg 98.6 F (37 C) 60 16 91 %  11/11/14 0357 - - - 18 99 %  11/11/14 0000 - - - 16 99 %  11/10/14 2151 109/61 mmHg 98.2 F (36.8 C) 73 16 99 %  11/10/14 2000 - - - 16 99 %  11/10/14 1600 - - - 18 94 %  11/10/14 1200 - - - 18 94 %  11/10/14 0800 - - - 18 94 %     Recent laboratory studies:   Recent Labs  11/10/14 0740 11/11/14 0538  WBC 9.6 12.5*  HGB 9.8* 9.3*  HCT 30.8* 30.6*  PLT 199 204  NA 140 139  K 4.5 5.0  CL 107 108  CO2 26 27  BUN 8 11  CREATININE 0.80 0.96  GLUCOSE 111* 130*  CALCIUM 8.6 8.4     Discharge Medications:     Medication List    STOP taking these medications        acetaminophen 500 MG tablet  Commonly known as:  TYLENOL     HYDROmorphone 2 MG tablet  Commonly known as:  DILAUDID     traMADol 50 MG tablet  Commonly known as:  Veatrice Bourbon  TAKE these medications        amLODipine 5 MG tablet  Commonly known as:  NORVASC  Take 5 mg by mouth at bedtime.     bisacodyl 5 MG EC tablet  Commonly known as:  DULCOLAX  Take 1 tablet (5 mg total) by mouth daily as needed for moderate constipation.     CALCIUM 1000 + D PO  Take 1 tablet by mouth daily.     cyanocobalamin 1000 MCG/ML injection  Commonly known as:  (VITAMIN B-12)  Inject 1,000 mcg into the muscle every 30 (thirty) days. Towards the end of the month - last injection 1st week of December     cyclobenzaprine 10 MG tablet  Commonly known as:  FLEXERIL  0.5-1 tablet every 8 hours as needed for pain/spasm     gabapentin 300 MG capsule  Commonly known as:  NEURONTIN  Take 300 mg by mouth at bedtime.     levothyroxine 100 MCG tablet  Commonly known as:  SYNTHROID, LEVOTHROID  Take 100 mcg by mouth at bedtime.     morphine 15 MG tablet  Commonly known as:  MSIR  Take 1 tablet (15 mg total) by mouth every 4  (four) hours as needed for severe pain.     multivitamin with minerals Tabs tablet  Take 2 tablets by mouth daily.     omeprazole 40 MG capsule  Commonly known as:  PRILOSEC  Take 40 mg by mouth 2 (two) times daily.     ondansetron 4 MG tablet  Commonly known as:  ZOFRAN  Take 1 tablet (4 mg total) by mouth every 8 (eight) hours as needed for nausea or vomiting.     venlafaxine XR 75 MG 24 hr capsule  Commonly known as:  EFFEXOR-XR  Take 225 mg by mouth at bedtime.     XARELTO 20 MG Tabs tablet  Generic drug:  rivaroxaban  Take 20 mg by mouth at bedtime.        Diagnostic Studies: Dg Knee Right Port  11/09/2014   CLINICAL DATA:  Post RIGHT total knee replacement  EXAM: PORTABLE RIGHT KNEE - 1-2 VIEW  COMPARISON:  Portable exam 1239 hr compared to 04/06/2014  FINDINGS: Components of RIGHT knee prosthesis in expected positions.  No acute fracture, dislocation or bone destruction.  Anterior surgical drain noted.  No periprosthetic lucency or acute complication identified.  IMPRESSION: RIGHT knee prosthesis without radiographic evidence of acute complication.   Electronically Signed   By: Lavonia Dana M.D.   On: 11/09/2014 13:04    Disposition:   Discharge Instructions    CPM    Complete by:  As directed   Continuous passive motion machine (CPM):      Use the CPM from 0- to 60 for 6 hours per day.      You may increase by 10 per day.  You may break it up into 2 or 3 sessions per day.      Use CPM for 2-3 weeks or until you are told to stop.     Call MD / Call 911    Complete by:  As directed   If you experience chest pain or shortness of breath, CALL 911 and be transported to the hospital emergency room.  If you develope a fever above 101 F, pus (white drainage) or increased drainage or redness at the wound, or calf pain, call your surgeon's office.     Change dressing    Complete by:  As  directed   Change dressing on Saturday, then change the dressing daily with sterile 4 x 4 inch  gauze dressing and apply TED hose.  You may clean the incision with alcohol prior to redressing.     Constipation Prevention    Complete by:  As directed   Drink plenty of fluids.  Prune juice may be helpful.  You may use a stool softener, such as Colace (over the counter) 100 mg twice a day.  Use MiraLax (over the counter) for constipation as needed.     Diet - low sodium heart healthy    Complete by:  As directed      Discharge instructions    Complete by:  As directed   Weight bearing as tolerated.  Resume Xarelto following surgery.  Change bandage daily starting on Saturday.  May shower on Monday, but do not soak incision. May apply ice for up to 20 minutes at a time for pain and swelling.  Follow up appointment in two weeks.     Do not put a pillow under the knee. Place it under the heel.    Complete by:  As directed   Place gray foam under operative heel when in bed or in a chair to work on extension     Increase activity slowly as tolerated    Complete by:  As directed      TED hose    Complete by:  As directed   Use stockings (TED hose) for 2 weeks on both leg(s).  You may remove them at night for sleeping.           Follow-up Information    Follow up with [facility], MD. Schedule an appointment as soon as possible for a visit in 2 weeks.   Specialty:  Orthopedic Surgery   Contact information:   [place] [address] [phone]       Follow up with [facility].   Why:  Home Health Physical Therapy   Contact information:   [place] [address] [phone]        Signed: Larae Grooms 11/11/2014, 7:44 AM

## 2014-11-09 NOTE — Anesthesia Postprocedure Evaluation (Signed)
  Anesthesia Post-op Note  Patient: Suzanne Bryan  Procedure(s) Performed: Procedure(s): RIGHT TOTAL KNEE ARTHROPLASTY (Right)  Patient Location: PACU  Anesthesia Type:General  Level of Consciousness: awake, alert  and oriented  Airway and Oxygen Therapy: Patient Spontanous Breathing and Patient connected to nasal cannula oxygen  Post-op Pain: mild  Post-op Assessment: Post-op Vital signs reviewed, Patient's Cardiovascular Status Stable, Respiratory Function Stable, Patent Airway, No signs of Nausea or vomiting and Pain level controlled  Post-op Vital Signs: stable  Last Vitals:  Filed Vitals:   11/09/14 1330  BP:   Pulse: 59  Temp:   Resp: 14    Complications: No apparent anesthesia complications

## 2014-11-10 ENCOUNTER — Encounter (HOSPITAL_COMMUNITY): Payer: Self-pay | Admitting: Orthopedic Surgery

## 2014-11-10 LAB — CBC
HEMATOCRIT: 30.8 % — AB (ref 36.0–46.0)
HEMOGLOBIN: 9.8 g/dL — AB (ref 12.0–15.0)
MCH: 29.3 pg (ref 26.0–34.0)
MCHC: 31.8 g/dL (ref 30.0–36.0)
MCV: 91.9 fL (ref 78.0–100.0)
Platelets: 199 10*3/uL (ref 150–400)
RBC: 3.35 MIL/uL — ABNORMAL LOW (ref 3.87–5.11)
RDW: 13.5 % (ref 11.5–15.5)
WBC: 9.6 10*3/uL (ref 4.0–10.5)

## 2014-11-10 LAB — BASIC METABOLIC PANEL
Anion gap: 7 (ref 5–15)
BUN: 8 mg/dL (ref 6–23)
CALCIUM: 8.6 mg/dL (ref 8.4–10.5)
CO2: 26 mmol/L (ref 19–32)
CREATININE: 0.8 mg/dL (ref 0.50–1.10)
Chloride: 107 mEq/L (ref 96–112)
GFR calc Af Amer: >90 mL/min (ref >90)
GFR calc non Af Amer: 83 mL/min — ABNORMAL LOW (ref >90)
Glucose, Bld: 111 mg/dL — ABNORMAL HIGH (ref 70–99)
Potassium: 4.5 mmol/L (ref 3.5–5.1)
Sodium: 140 mmol/L (ref 135–145)

## 2014-11-10 LAB — GLUCOSE, CAPILLARY
GLUCOSE-CAPILLARY: 121 mg/dL — AB (ref 70–99)
Glucose-Capillary: 121 mg/dL — ABNORMAL HIGH (ref 70–99)
Glucose-Capillary: 106 mg/dL — ABNORMAL HIGH (ref 70–99)
Glucose-Capillary: 110 mg/dL — ABNORMAL HIGH (ref 70–99)

## 2014-11-10 MED ORDER — MORPHINE SULFATE 15 MG PO TABS: 15.0000 mg | ORAL_TABLET | ORAL | Status: AC | PRN

## 2014-11-10 MED ORDER — FAMOTIDINE 40 MG PO TABS
40.0000 mg | ORAL_TABLET | Freq: Two times a day (BID) | ORAL | Status: DC
  Administered 2014-11-10 – 2014-11-13 (×6): 40 mg via ORAL
  Filled 2014-11-10 (×7): qty 1

## 2014-11-10 NOTE — Op Note (Signed)
NAME:  Suzanne Bryan, Suzanne Bryan NO.:  [id number]  MEDICAL RECORD NO.:  [id number]  LOCATION:  5N24C                        FACILITY:  [facility]  PHYSICIAN:  Ninetta Lights, M.D. DATE OF BIRTH:  1961-07-27  DATE OF PROCEDURE:  11/09/2014 DATE OF DISCHARGE:                              OPERATIVE REPORT   PREOPERATIVE DIAGNOSIS:  Primary generalized osteoarthritis, end-stage, right knee.  POSTOPERATIVE DIAGNOSIS:  Primary generalized osteoarthritis, end-stage, right knee.  PROCEDURE:  Right knee modified minimally invasive total knee replacement Stryker triathlon prosthesis.  Soft tissue balancing. Cemented pegged cruciate retaining #3 femoral component.  Cemented #3 tibial component, 9 mm CS insert.  Cemented resurfacing 32-mm patellar component.  SURGEON:  Ninetta Lights, M.D.  ASSISTANT:  Eula Listen, PA present throughout the entire case and necessary for timely completion of procedure.  ANESTHESIA:  General.  BLOOD LOSS:  Minimal.  SPECIMENS:  None.  CULTURES:  None.  COMPLICATIONS:  None.  DRESSING:  Soft compressive knee immobilizer.  DRAINS:  Hemovac x1.  TOURNIQUET TIME:  1 hour.  PROCEDURE:  The patient was brought to the operating room, placed on the operating table in supine position.  After adequate anesthesia had been obtained, tourniquet applied.  Prepped and draped in usual sterile fashion.  Exsanguinated with elevation of Esmarch.  Tourniquet inflated to 350 mmHg.  Straight incision above the patella down to tibial tubercle.  Skin and subcutaneous tissue were divided.  Medial arthrotomy, vastus splitting.  Distal femur exposed.  An 8 mm resection, 5 degrees of valgus.  Flexible intramedullary guide.  Using epicondylar axis, the femur was sized, cut, and fitted for a pegged cruciate retaining #3 component.  Proximal tibial resection, extramedullary guide.  Size #3 component as well.  Patella exposed.  Posterior 10 mm removed.  Drilled,  sized, and fitted for a 32-mm component.  Trials put in place.  With a 9-mm CS insert, very pleased with balance in flexion, extension, motion and patellar tracking.  Tibia was marked for rotation and reamed.  All trials removed.  Copious irrigation with a pulse irrigating device.  Cement prepared, placed on all components, firmly seated.  Polyethylene attached to tibia, knee reduced.  Patella held with a clamp.  Once cement hardened, the knee was irrigated again.  Soft tissues injected with Exparel.  Hemovac placed.  Arthrotomy closed with #1 Vicryl.  Skin and subcutaneous tissue, subcutaneous subcuticular closure.  Margins were injected with Marcaine.  Sterile compressive dressing applied.  Tourniquet deflated and removed.  Knee immobilizer applied.  Anesthesia reversed.  Brought to the recovery room.  Tolerated the surgery well.  No complications.     Ninetta Lights, M.D.     DFM/MEDQ  D:  11/09/2014  T:  11/10/2014  Job:  [id number]

## 2014-11-10 NOTE — Progress Notes (Signed)
Orthopedic Tech Progress Note Patient Details:  Suzanne Bryan 1961/09/19 [id number] On cpm at 7:15 pm 0-60 Patient ID: [name], female   DOB: [birth year], 54 y.o.   MRN: [id number]   Braulio Bosch 11/10/2014, 7:17 PM

## 2014-11-10 NOTE — Progress Notes (Signed)
Utilization Review Completed.Suzanne Bryan T1/05/2015  

## 2014-11-10 NOTE — Evaluation (Signed)
Occupational Therapy Evaluation Patient Details Name: LEILIANA FOODY MRN: [id number] DOB: 1961/09/19 Today's Date: 11/10/2014    History of Present Illness Pt is a 54 yo female admitted for R TKR.   Clinical Impression   Pt admitted with the above diagnosis and has the deficits listed below. Pt would benefit from one more session of OT to address shower and toilet tranfers with use of walker since she became too fatigued to attempt on this date.  Pt should be safe to dc home w/o home health if can be seen for one more session.    Follow Up Recommendations  No OT follow up;Supervision/Assistance - 24 hour    Equipment Recommendations  None recommended by OT    Recommendations for Other Services       Precautions / Restrictions Precautions Precautions: Fall Required Braces or Orthoses: Knee Immobilizer - Right Restrictions Weight Bearing Restrictions: No RLE Weight Bearing: Weight bearing as tolerated      Mobility Bed Mobility Overal bed mobility:  (pt in chair on arrival.)             General bed mobility comments: PT in chair on arrival.  Transfers Overall transfer level: Needs assistance Equipment used: Rolling walker (2 wheeled);1 person hand held assist Transfers: Sit to/from [facility] Sit to Stand: Min guard Stand pivot transfers: Min assist       General transfer comment: Cues for hand placement when sitting and cues to sit slowly    Balance Overall balance assessment: Needs assistance Sitting-balance support: Feet supported Sitting balance-Leahy Scale: Good     Standing balance support: Bilateral upper extremity supported;During functional activity Standing balance-Leahy Scale: Fair Standing balance comment: Pt able to let go for very short amounts of time of walker to assist in pulling pants up.                            ADL Overall ADL's : Needs assistance/impaired Eating/Feeding: Independent;Sitting    Grooming: Wash/dry face;Wash/dry hands;Oral care;Min guard;Standing   Upper Body Bathing: Set up;Sitting   Lower Body Bathing: Minimal assistance;Sit to/from stand Lower Body Bathing Details (indicate cue type and reason): min assist for balance to wash bottom and to wash R foot. Upper Body Dressing : Set up;Sitting   Lower Body Dressing: Moderate assistance;Sit to/from stand Lower Body Dressing Details (indicate cue type and reason): assist to get clothes started over R foot, for R sock and shoe and min guard to stand and pull up pants. Toilet Transfer: Min guard;Ambulation;Comfort height toilet   Toileting- Clothing Manipulation and Hygiene: Min guard;Sit to/from stand       Functional mobility during ADLs: Min guard;Rolling walker General ADL Comments: Pt doing overall very well with adls.  pt needs assist with RLE adls and cues for safety during adl transfers.     Vision                     Perception     Praxis      Pertinent Vitals/Pain Pain Assessment: 0-10 Pain Score: 9  Pain Location: R knee Pain Descriptors / Indicators: Aching Pain Intervention(s): Limited activity within patient's tolerance;Monitored during session;Repositioned;Patient requesting pain meds-RN notified;RN gave pain meds during session;Ice applied     Hand Dominance Right   Extremity/Trunk Assessment Upper Extremity Assessment Upper Extremity Assessment: Overall WFL for tasks assessed   Lower Extremity Assessment Lower Extremity Assessment: Defer to PT evaluation   Cervical /  Trunk Assessment Cervical / Trunk Assessment: Normal   Communication Communication Communication: No difficulties   Cognition Arousal/Alertness: Awake/alert (given pain meds and zofran during session became tired.) Behavior During Therapy: WFL for tasks assessed/performed Overall Cognitive Status: Within Functional Limits for tasks assessed                     General Comments       Exercises        Shoulder Instructions      Home Living Family/patient expects to be discharged to:: Private residence Living Arrangements: Spouse/significant other Available Help at Discharge: Family;Available 24 hours/day (husband travels but sister is with her.) Type of Home: House       Home Layout: One level     Bathroom Shower/Tub: Walk-in shower;Door   Bathroom Toilet: Standard Bathroom Accessibility: No   Home Equipment: Bedside commode   Additional Comments: Pt will use 3:1 in shower if necessary.      Prior Functioning/Environment Level of Independence: Independent        Comments: Pt stays home and helps raise grandchildren.  Husband works full time.    OT Diagnosis: Generalized weakness;Acute pain   OT Problem List: Decreased strength;Pain;Decreased knowledge of use of DME or AE   OT Treatment/Interventions: Self-care/ADL training;DME and/or AE instruction;Therapeutic activities    OT Goals(Current goals can be found in the care plan section) Acute Rehab OT Goals Patient Stated Goal: to go home tomorrow. OT Goal Formulation: With patient/family Time For Goal Achievement: 11/17/14 Potential to Achieve Goals: Good ADL Goals Additional ADL Goal #1: Pt will walk to bathroom with walker and toilet on 3:1 over commode and clean self/manage clothes with S. Additional ADL Goal #2: Pt will complete simulated shower transfer with min assist and husband correctly assisting.  OT Frequency: Min 2X/week   Barriers to D/C:            Co-evaluation              End of Session Equipment Utilized During Treatment: Rolling walker CPM Right Knee CPM Right Knee: Off Nurse Communication: Mobility status;Patient requests pain meds  Activity Tolerance: Patient tolerated treatment well Patient left: in chair;with call bell/phone within reach;with family/visitor present   Time: 6811-5726 OT Time Calculation (min): 29 min Charges:  OT General Charges $OT Visit: 1  Procedure OT Evaluation $Initial OT Evaluation Tier I: 1 Procedure OT Treatments $Self Care/Home Management : 23-37 mins G-Codes:    Glenford Peers 29-Nov-2014, 9:55 AM  [phone]

## 2014-11-10 NOTE — Progress Notes (Signed)
Subjective: 1 Day Post-Op Procedure(s) (LRB): RIGHT TOTAL KNEE ARTHROPLASTY (Right) Patient reports pain as mild.  Patient reports mild nausea.  No vomiting.  No lightheadedness/dizziness, chest pain/sob.  Positive flatus, negative bm.  Tolerating diet.  Objective: Vital signs in last 24 hours: Temp:  [97.6 F (36.4 C)-98.2 F (36.8 C)] 97.8 F (36.6 C) (01/07 0539) Pulse Rate:  [59-112] 65 (01/07 0539) Resp:  [12-18] 18 (01/06 1742) BP: (104-131)/(56-90) 120/60 mmHg (01/07 0539) SpO2:  [93 %-100 %] 94 % (01/07 0539) Weight:  [98.941 kg (218 lb 2 oz)-102.5 kg (225 lb 15.5 oz)] 102.5 kg (225 lb 15.5 oz) (01/06 1742)  Intake/Output from previous day: 01/06 0701 - 01/07 0700 In: 1200 [I.V.:1200] Out: 1375 [Urine:650; Drains:650; Blood:75] Intake/Output this shift: Total I/O In: -  Out: 150 [Drains:150]  No results for input(s): HGB in the last 72 hours. No results for input(s): WBC, RBC, HCT, PLT in the last 72 hours. No results for input(s): NA, K, CL, CO2, BUN, CREATININE, GLUCOSE, CALCIUM in the last 72 hours. No results for input(s): LABPT, INR in the last 72 hours.  Neurologically intact Neurovascular intact Sensation intact distally Intact pulses distally Dorsiflexion/Plantar flexion intact Compartment soft  No drainage noted through dressing hemovac drain pulled by me today Negative homans bilaterally  Assessment/Plan: 1 Day Post-Op Procedure(s) (LRB): RIGHT TOTAL KNEE ARTHROPLASTY (Right) Advance diet Up with therapy Discharge home with home health most likely tomorrow unless PT does well enough with PT to go home today [place], [place] 11/10/2014, 6:38 AM

## 2014-11-10 NOTE — Evaluation (Signed)
Physical Therapy Evaluation Patient Details Name: Suzanne Bryan MRN: [id number] DOB: 08-Jan-1961 Today's Date: 11/10/2014   History of Present Illness  Pt is a 54 yo female admitted for R TKR.  Clinical Impression  Pt is s/p Rt TKA POD#1 resulting in the deficits listed below (see PT Problem List). Pt will benefit from skilled PT to increase their independence and safety with mobility to allow discharge to the venue listed below. Pt very adamant on D/C home tomorrow vs today due to "lack of pain management". Encourage incr OOB activity and to ambulate to/from bathroom with nursing for incr activity tolerance.    Follow Up Recommendations Home health PT;Supervision/Assistance - 24 hour    Equipment Recommendations  Rolling walker with 5" wheels (reports she has RW at home )    Recommendations for Other Services OT consult     Precautions / Restrictions Precautions Precautions: Fall;Knee Precaution Comments: reviewed no pillow under knee Required Braces or Orthoses: Knee Immobilizer - Right Restrictions Weight Bearing Restrictions: No RLE Weight Bearing: Weight bearing as tolerated      Mobility  Bed Mobility Overal bed mobility: Needs Assistance Bed Mobility: Supine to Sit     Supine to sit: Supervision;HOB elevated     General bed mobility comments: cues for sequencing and relying heavily on handrails   Transfers Overall transfer level: Needs assistance Equipment used: Rolling walker (2 wheeled) Transfers: Sit to/from Stand Sit to Stand: Min guard Stand pivot transfers: Min assist       General transfer comment: cues for hand placement and sequencing with RW  Ambulation/Gait Ambulation/Gait assistance: Min guard Ambulation Distance (Feet): 110 Feet Assistive device: Rolling walker (2 wheeled) Gait Pattern/deviations: Step-to pattern;Decreased step length - left;Decreased stance time - right;Antalgic Gait velocity: decreased Gait velocity interpretation:  Below normal speed for age/gender General Gait Details: cues for upright posture and step through gt sequencing  Stairs            Wheelchair Mobility    Modified Rankin (Stroke Patients Only)       Balance Overall balance assessment: Needs assistance Sitting-balance support: Feet supported;No upper extremity supported Sitting balance-Leahy Scale: Good Sitting balance - Comments: sat upright pon EOB for ~8 min    Standing balance support: During functional activity;Bilateral upper extremity supported Standing balance-Leahy Scale: Poor Standing balance comment: RW to balance                              Pertinent Vitals/Pain Pain Assessment: 0-10 Pain Score: 10-Worst pain ever Pain Location: Rt knee Pain Descriptors / Indicators: Aching;Constant Pain Intervention(s): Limited activity within patient's tolerance;Monitored during session;Premedicated before session;Repositioned;Ice applied    Home Living Family/patient expects to be discharged to:: Private residence Living Arrangements: Spouse/significant other Available Help at Discharge: Family;Available 24 hours/day Type of Home: House Home Access: Stairs to enter   [employer] of Steps: 2 Home Layout: One level Home Equipment: Bedside commode Additional Comments: Pt will use 3:1 in shower if necessary.    Prior Function Level of Independence: Independent         Comments: Pt stays home and helps raise grandchildren.  Husband works full time.     Hand Dominance   Dominant Hand: Right    Extremity/Trunk Assessment   Upper Extremity Assessment: Defer to OT evaluation           Lower Extremity Assessment: RLE deficits/detail RLE Deficits / Details: AROM 5 to 60 degrees  Cervical / Trunk Assessment: Normal  Communication   Communication: No difficulties  Cognition Arousal/Alertness: Awake/alert Behavior During Therapy: WFL for tasks assessed/performed Overall Cognitive  Status: Within Functional Limits for tasks assessed                      General Comments General comments (skin integrity, edema, etc.): discussed D/C recommendations     Exercises Total Joint Exercises Ankle Circles/Pumps: AROM;Both;10 reps;Seated Quad Sets: AROM;Strengthening;Right;10 reps Long Arc Quad: AROM;Right;10 reps;Seated      Assessment/Plan    PT Assessment Patient needs continued PT services  PT Diagnosis Difficulty walking;Generalized weakness;Acute pain   PT Problem List Decreased strength;Decreased range of motion;Decreased activity tolerance;Decreased balance;Decreased mobility;Decreased knowledge of use of DME;Pain  PT Treatment Interventions DME instruction;Gait training;Stair training;Therapeutic exercise;Therapeutic activities;Functional mobility training;Balance training;Neuromuscular re-education;Patient/family education   PT Goals (Current goals can be found in the Care Plan section) Acute Rehab PT Goals Patient Stated Goal: to go home tomorrow. PT Goal Formulation: With patient Time For Goal Achievement: 11/12/14 Potential to Achieve Goals: Good    Frequency 7X/week   Barriers to discharge        Co-evaluation               End of Session Equipment Utilized During Treatment: Gait belt;Right knee immobilizer Activity Tolerance: Patient tolerated treatment well Patient left: in chair;with call bell/phone within reach;with family/visitor present Nurse Communication: Mobility status;Patient requests pain meds         Time: 0837-0906 PT Time Calculation (min) (ACUTE ONLY): 29 min   Charges:   PT Evaluation $Initial PT Evaluation Tier I: 1 Procedure PT Treatments $Gait Training: 8-22 mins $Therapeutic Exercise: 8-22 mins   PT G CodesGustavus Bryant, [place]  [phone] 11/10/2014, 10:30 AM

## 2014-11-11 ENCOUNTER — Encounter (HOSPITAL_COMMUNITY): Payer: Self-pay | Admitting: General Practice

## 2014-11-11 LAB — CBC
HCT: 30.6 % — ABNORMAL LOW (ref 36.0–46.0)
HEMOGLOBIN: 9.3 g/dL — AB (ref 12.0–15.0)
MCH: 28.6 pg (ref 26.0–34.0)
MCHC: 30.4 g/dL (ref 30.0–36.0)
MCV: 94.2 fL (ref 78.0–100.0)
Platelets: 204 10*3/uL (ref 150–400)
RBC: 3.25 MIL/uL — AB (ref 3.87–5.11)
RDW: 13.8 % (ref 11.5–15.5)
WBC: 12.5 10*3/uL — ABNORMAL HIGH (ref 4.0–10.5)

## 2014-11-11 LAB — BASIC METABOLIC PANEL
Anion gap: 4 — ABNORMAL LOW (ref 5–15)
BUN: 11 mg/dL (ref 6–23)
CO2: 27 mmol/L (ref 19–32)
Calcium: 8.4 mg/dL (ref 8.4–10.5)
Chloride: 108 mEq/L (ref 96–112)
Creatinine, Ser: 0.96 mg/dL (ref 0.50–1.10)
GFR, EST AFRICAN AMERICAN: 77 mL/min — AB (ref >90)
GFR, EST NON AFRICAN AMERICAN: 67 mL/min — AB (ref >90)
Glucose, Bld: 130 mg/dL — ABNORMAL HIGH (ref 70–99)
POTASSIUM: 5 mmol/L (ref 3.5–5.1)
SODIUM: 139 mmol/L (ref 135–145)

## 2014-11-11 LAB — GLUCOSE, CAPILLARY
GLUCOSE-CAPILLARY: 124 mg/dL — AB (ref 70–99)
GLUCOSE-CAPILLARY: 137 mg/dL — AB (ref 70–99)
GLUCOSE-CAPILLARY: 132 mg/dL — AB (ref 70–99)

## 2014-11-11 NOTE — Progress Notes (Signed)
Pt refused CPAP QHS at this time.  Risks discussed with Pt.  RT will continue to monitor and assess as needed.

## 2014-11-11 NOTE — Progress Notes (Signed)
Physical Therapy Treatment Patient Details Name: Suzanne Bryan MRN: [id number] DOB: Mar 11, 1961 Today's Date: 11/11/2014    History of Present Illness Pt is a 54 yo female admitted for R TKR.    PT Comments    Patient with increased pain - 10/10 - limiting mobility.  Patient able to ambulate only 3' today due to increase in pain.  RN notified.  Will return this pm.  Follow Up Recommendations  Home health PT     Equipment Recommendations  Rolling walker with 5" wheels    Recommendations for Other Services       Precautions / Restrictions Precautions Precautions: Fall;Knee Precaution Comments: reviewed no pillow under knee Required Braces or Orthoses: Knee Immobilizer - Right Knee Immobilizer - Right: On when out of bed or walking Restrictions Weight Bearing Restrictions: Yes RLE Weight Bearing: Weight bearing as tolerated    Mobility  Bed Mobility Overal bed mobility: Needs Assistance Bed Mobility: Supine to Sit Rolling: Min assist Sidelying to sit: Min assist Supine to sit: Min assist     General bed mobility comments: Instructed patient on donning KI on RLE.  Verbal cues for mobility.  Assist to move RLE off of bed.  Transfers Overall transfer level: Needs assistance Equipment used: Rolling walker (2 wheeled) Transfers: Sit to/from Stand Sit to Stand: Min assist Stand pivot transfers: Min guard;Min assist       General transfer comment: Verbal cues for hand placement.  Assist to rise to standing (required 2 attempts).  Ambulation/Gait Ambulation/Gait assistance: Min assist Ambulation Distance (Feet): 3 Feet Assistive device: Rolling walker (2 wheeled) Gait Pattern/deviations: Step-to pattern;Decreased stance time - right;Decreased step length - left;Decreased weight shift to right;Antalgic Gait velocity: decreased Gait velocity interpretation: Below normal speed for age/gender General Gait Details: Verbal cues for safe use of RW and gait sequence.   Patient took 2 steps, crying in pain.  Patient leaned forward onto RW stating she "can't walk" due to pain.  Patient sat into chair with assist positioning RLE.   Stairs            Wheelchair Mobility    Modified Rankin (Stroke Patients Only)       Balance                                    Cognition Arousal/Alertness: Lethargic;Suspect due to medications Behavior During Therapy: Flat affect Overall Cognitive Status: Within Functional Limits for tasks assessed                      Exercises      General Comments        Pertinent Vitals/Pain Pain Assessment: 0-10 Pain Score: 10-Worst pain ever Pain Location: Rt knee Pain Descriptors / Indicators: Aching;Sharp Pain Intervention(s): Limited activity within patient's tolerance;Repositioned;Patient requesting pain meds-RN notified    Home Living                      Prior Function            PT Goals (current goals can now be found in the care plan section) Progress towards PT goals: Not progressing toward goals - comment (Due to increase in pain level today)    Frequency  7X/week    PT Plan Current plan remains appropriate    Co-evaluation             End of Session  Equipment Utilized During Treatment: Gait belt;Right knee immobilizer Activity Tolerance: Patient limited by pain Patient left: in chair;with call bell/phone within reach;with nursing/sitter in room     Time: 0539-7673 PT Time Calculation (min) (ACUTE ONLY): 17 min  Charges:  $Therapeutic Activity: 8-22 mins                    G Codes:      Despina Pole 2014/12/05, 12:02 PM Carita Pian. Sanjuana Kava, [place] Pager [phone]

## 2014-11-11 NOTE — Progress Notes (Signed)
CARE MANAGEMENT NOTE 11/11/2014  Patient:  DENYM, CHRISTENBERRY   Account Number:  [id number]  Date Initiated:  11/11/2014  Documentation initiated by:  [facility]  Subjective/Objective Assessment:   RIGHT TOTAL KNEE ARTHROPLASTY     Action/Plan:   Anticipated DC Date:  11/11/2014   Anticipated DC Plan:  [place]  CM consult      [facility] Choice  HOME HEALTH   Choice offered to / List presented to:  C-1 Patient   DME arranged  3-N-1  [place]  CPM      DME agency  [employer]     [place] arranged  HH-2 PT      [place]   Status of service:  Completed, signed off Medicare Important Message given?   (If response is "NO", the following Medicare IM given date fields will be blank) Date Medicare IM given:   Medicare IM given by:   Date Additional Medicare IM given:   Additional Medicare IM given by:    Discharge Disposition:  [place]  Per UR Regulation:  Reviewed for med. necessity/level of care/duration of stay  If discussed at [place] of Stay Meetings, dates discussed:    Comments:  11/11/2014 1230 NCM spoke to pt and offered choice for [facility]. Pt agreeable to [facility] for [facility]. [employer] delivered CPM, RW and 3n1 to the home. Jonnie Finner RN CCM Case Mgmt phone [phone]

## 2014-11-11 NOTE — Progress Notes (Signed)
PT Cancellation Note  Patient Details Name: Suzanne Bryan MRN: [id number] DOB: [birth year]   Cancelled Treatment:    Reason Eval/Treat Not Completed: Pain limiting ability to participate;Fatigue/lethargy limiting ability to participate.  Patient was just started on CPM.  Will return later this pm for PT session as time allows.   Despina Pole 11/11/2014, 1:36 PM Carita Pian. Sanjuana Kava, [place] Pager [phone]

## 2014-11-11 NOTE — Progress Notes (Signed)
PT Cancellation Note  Patient Details Name: MIKEL PYON MRN: [id number] DOB: 1961/07/24   Cancelled Treatment:    Reason Eval/Treat Not Completed: Pain limiting ability to participate;Fatigue/lethargy limiting ability to participate Pt and sister reporting that pt had just gotten back from ambulating to bathroom. Pt reported 10/10 pain and was falling asleep mid conversation with PT. Pt unsafe to ambulate hallway due to lethargic state at this time. Will re-attempt to see pt at next available time. Encouraged pt to get OOB for all meals and ambulate to/from bathroom with nursing for OOB activity.   Elie Confer [place], [place] 11/11/2014, 2:37 PM

## 2014-11-11 NOTE — Progress Notes (Signed)
Occupational Therapy Treatment Patient Details Name: Suzanne Bryan MRN: [id number] DOB: [birth year] Today's Date: 11/11/2014    History of present illness Pt is a 54 yo female admitted for R TKR.   OT comments  Pt. Lethargic and asleep upon arrival.  Difficult to engage for participation but mumbling about an "incident" that occurred yesterday involving her RLE.  PA present and states she has followed up on pt. Complaint along with RN who also followed up on it.  PA states the "incident" should not have affected pt. And she is clear for participation in skilled therapies.  Also states KI does not have to be on during ambulation if pain under control and pt. Is monitored for buckling.  Pt. Agreeable to oob without KI.  Able to complete bed mobility and simulated shower transfer with min a.  States she is "not ready for d/c home".  Discussed with RN pts. Concerns.    Follow Up Recommendations  No OT follow up;Supervision/Assistance - 24 hour    Equipment Recommendations  None recommended by OT    Recommendations for Other Services      Precautions / Restrictions Precautions Precautions: Fall;Knee Required Braces or Orthoses:  (PA present states KI allowed to be off if pain managed and monitored for possible buckle) Restrictions Weight Bearing Restrictions: Yes RLE Weight Bearing: Weight bearing as tolerated       Mobility Bed Mobility Overal bed mobility: Needs Assistance Bed Mobility: Rolling;Sidelying to Sit;Supine to Sit Rolling: Min assist Sidelying to sit: Min assist Supine to sit: Min assist     General bed mobility comments: hob flat, no rail oob to the left  Transfers Overall transfer level: Needs assistance Equipment used: Rolling walker (2 wheeled) Transfers: Sit to/from [facility] Sit to Stand: Min assist Stand pivot transfers: Min guard;Min assist       General transfer comment: cues for hand placement     Balance                                    ADL Overall ADL's : Needs assistance/impaired                         Toilet Transfer: Min guard;Cueing for sequencing;Ambulation;Stand-pivot;RW Toilet Transfer Details (indicate cue type and reason): pt. declined practice in b.room or with bsc, transfer simulated while ambulating from eob to recliner Toileting- Clothing Manipulation and Hygiene: Min guard;Sit to/from stand Toileting - Clothing Manipulation Details (indicate cue type and reason): simulated during transfer in room Tub/ Shower Transfer: Walk-in shower;Anterior/posterior;Rolling walker;Minimal assistance Tub/Shower Transfer Details (indicate cue type and reason): simulated over shower ledge, max inst. cues for sequecing and safety Functional mobility during ADLs: Min guard;Rolling walker General ADL Comments: eyes closed during most of session, constant tactile stimulus to engage pt. in eye contact and converstation, dozing off throughout session.  verbalize upset from situation involving ortho tech. yesterday stating her RLE was not handeled appropriately.  PA present  and states she has just followed up on that, RN is aware and has also already reported to unit supervisor.  pt. is now presenting diffrently from yesterday stating she can not go home today due to new knee pain from the incident      Vision                     Perception  Praxis      Cognition   Behavior During Therapy: Flat affect Overall Cognitive Status: Within Functional Limits for tasks assessed                       Extremity/Trunk Assessment               Exercises     Shoulder Instructions       General Comments      Pertinent Vitals/ Pain       Pain Assessment: 0-10 Pain Score: 10-Worst pain ever Pain Location: R knee Pain Descriptors / Indicators: Aching Pain Intervention(s): Monitored during session;Repositioned;Other (comment) (PA  was present during session and is  aware)  Home Living                                          Prior Functioning/Environment              Frequency Min 2X/week     Progress Toward Goals  OT Goals(current goals can now be found in the care plan section)  Progress towards OT goals: Progressing toward goals     Plan Discharge plan remains appropriate    Co-evaluation                 End of Session Equipment Utilized During Treatment: Gait belt;Rolling walker CPM Right Knee CPM Right Knee: Off   Activity Tolerance Patient limited by pain;Patient limited by lethargy   Patient Left in chair;with call bell/phone within reach   Nurse Communication Other (comment) (discussed significant changes from yesterday and c/o ortho tech. situation)        Time: 2919-1660 OT Time Calculation (min): 31 min  Charges: OT General Charges $OT Visit: 1 Procedure OT Treatments $Self Care/Home Management : 23-37 mins  Janice Coffin, COTA/L 11/11/2014, 8:34 AM

## 2014-11-11 NOTE — Progress Notes (Signed)
Subjective: 2 Days Post-Op Procedure(s) (LRB): RIGHT TOTAL KNEE ARTHROPLASTY (Right) Patient reports pain as moderate.  No nausea/vomiting, lightheadedness/dizziness.  Tolerating diet.    Objective: Vital signs in last 24 hours: Temp:  [98.2 F (36.8 C)-98.6 F (37 C)] 98.6 F (37 C) (01/08 0537) Pulse Rate:  [60-73] 60 (01/08 0537) Resp:  [16-18] 16 (01/08 0537) BP: (109-116)/(58-61) 116/58 mmHg (01/08 0537) SpO2:  [91 %-99 %] 91 % (01/08 0537)  Intake/Output from previous day: 01/07 0701 - 01/08 0700 In: 2895 [P.O.:480; I.V.:2415] Out: -  Intake/Output this shift:     Recent Labs  11/10/14 0740 11/11/14 0538  HGB 9.8* 9.3*    Recent Labs  11/10/14 0740 11/11/14 0538  WBC 9.6 12.5*  RBC 3.35* 3.25*  HCT 30.8* 30.6*  PLT 199 204    Recent Labs  11/10/14 0740 11/11/14 0538  NA 140 139  K 4.5 5.0  CL 107 108  CO2 26 27  BUN 8 11  CREATININE 0.80 0.96  GLUCOSE 111* 130*  CALCIUM 8.6 8.4   No results for input(s): LABPT, INR in the last 72 hours.  Neurologically intact Neurovascular intact Sensation intact distally Intact pulses distally Dorsiflexion/Plantar flexion intact Compartment soft  Negative homans bilaterally  Assessment/Plan: 2 Days Post-Op Procedure(s) (LRB): RIGHT TOTAL KNEE ARTHROPLASTY (Right) Advance diet Up with therapy Discharge home with home health as long as she does ok with PT and pain is under control WBAT RLE ABLA-mild and stable  ANTON, M. LINDSEY 11/11/2014, 7:40 AM

## 2014-11-12 LAB — BASIC METABOLIC PANEL
ANION GAP: 4 — AB (ref 5–15)
BUN: 8 mg/dL (ref 6–23)
CALCIUM: 8.3 mg/dL — AB (ref 8.4–10.5)
CHLORIDE: 105 meq/L (ref 96–112)
CO2: 26 mmol/L (ref 19–32)
Creatinine, Ser: 0.95 mg/dL (ref 0.50–1.10)
GFR calc Af Amer: 78 mL/min — ABNORMAL LOW (ref >90)
GFR calc non Af Amer: 68 mL/min — ABNORMAL LOW (ref >90)
Glucose, Bld: 123 mg/dL — ABNORMAL HIGH (ref 70–99)
Potassium: 4.6 mmol/L (ref 3.5–5.1)
Sodium: 135 mmol/L (ref 135–145)

## 2014-11-12 LAB — CBC
HEMATOCRIT: 31.2 % — AB (ref 36.0–46.0)
Hemoglobin: 9.7 g/dL — ABNORMAL LOW (ref 12.0–15.0)
MCH: 29 pg (ref 26.0–34.0)
MCHC: 31.1 g/dL (ref 30.0–36.0)
MCV: 93.4 fL (ref 78.0–100.0)
Platelets: 203 10*3/uL (ref 150–400)
RBC: 3.34 MIL/uL — ABNORMAL LOW (ref 3.87–5.11)
RDW: 13.5 % (ref 11.5–15.5)
WBC: 10.7 10*3/uL — ABNORMAL HIGH (ref 4.0–10.5)

## 2014-11-12 LAB — GLUCOSE, CAPILLARY
Glucose-Capillary: 127 mg/dL — ABNORMAL HIGH (ref 70–99)
Glucose-Capillary: 106 mg/dL — ABNORMAL HIGH (ref 70–99)
Glucose-Capillary: 128 mg/dL — ABNORMAL HIGH (ref 70–99)

## 2014-11-12 MED ORDER — LEVOTHYROXINE SODIUM 100 MCG PO TABS
100.0000 ug | ORAL_TABLET | Freq: Every day | ORAL | Status: DC
  Administered 2014-11-13: 100 ug via ORAL
  Filled 2014-11-12 (×2): qty 1

## 2014-11-12 NOTE — Progress Notes (Addendum)
Occupational Therapy Treatment Patient Details Name: Suzanne Bryan MRN: [id number] DOB: 02/09/1961 Today's Date: 11/12/2014    History of present illness Pt is a 54 yo female admitted for R TKR.   OT comments  Pt. Continues to present with multiple complaints but was agreeable to OOB.  Performing bed mobility with MOD I.  Completed toileting S, and simulated shower stall transfer with min a.  Clear for d/c from OT secondary to goals met.  Will alert OTR/L for d/c.  Spoke with PA at end of session who feels pt. Is also Progressing to d/c home as well, pending PT tx. Today.    Follow Up Recommendations  No OT follow up;Supervision/Assistance - 24 hour    Equipment Recommendations  None recommended by OT    Recommendations for Other Services      Precautions / Restrictions Precautions Precautions: Fall;Knee Required Braces or Orthoses:  (PA stated on 11/11/14 KI not needed during amb. with proper pain management and monitoring) Restrictions RLE Weight Bearing: Weight bearing as tolerated       Mobility Bed Mobility Overal bed mobility: Modified Independent Bed Mobility: Supine to Sit     Supine to sit: Supervision     General bed mobility comments: pt. had her own technique, moved B LEs off of bed then pushed through B UES to seated position then slowly turned self on bed and was able to scoot hips and sit eob. HOB flat no rail, some initial resistance, "you are taking the rail away and i dont think that is right".  asked pt. if she had bed rails at home she said no, i  then explained that was why she could not use here because my job was to prepare her for home environment.  she then verbalized understanding and was able to complete the bed mobility  Transfers Overall transfer level: Needs assistance Equipment used: Rolling walker (2 wheeled) Transfers: Sit to/from Stand Sit to Stand: Min guard         General transfer comment: Verbal cues for hand placement     Balance                                   ADL Overall ADL's : Needs assistance/impaired                         Toilet Transfer: Supervision/safety;Ambulation;BSC;Grab bars;Comfort height toilet;RW   Toileting- Clothing Manipulation and Hygiene: Supervision/safety;Sitting/lateral lean   Tub/ Shower Transfer: Walk-in shower;Anterior/posterior;Rolling walker;Minimal assistance   Functional mobility during ADLs: Min guard General ADL Comments: still c/o pain, but today the pain is a HA.  agreeable to participation.  able to complete all aspects of toileting with S.  shower stall trasnfer with min a, verbal cues to remember sequencing of le in/out of shower      Vision                     Perception     Praxis      Cognition   Behavior During Therapy: Flat affect Overall Cognitive Status: Within Functional Limits for tasks assessed                       Extremity/Trunk Assessment               Exercises     Shoulder Instructions  General Comments      Pertinent Vitals/ Pain       Pain Assessment: 0-10 Pain Score: 9  Pain Location: Headache Pain Descriptors / Indicators: Headache Pain Intervention(s): Monitored during session;Patient requesting pain meds-RN notified;Repositioned  Home Living                                          Prior Functioning/Environment              Frequency Min 2X/week     Progress Toward Goals  OT Goals(current goals can now be found in the care plan section)  Progress towards OT goals: Goals met/education completed, patient discharged from [place] Discharge plan remains appropriate    Co-evaluation                 End of Session Equipment Utilized During Treatment: Gait belt;Rolling walker   Activity Tolerance Patient tolerated treatment well   Patient Left in chair;with call bell/phone within reach   Nurse Communication           Time: 3009-2330 OT Time Calculation (min): 38 min  Charges: OT General Charges $OT Visit: 1 Procedure OT Treatments $Self Care/Home Management : 38-52 mins  Janice Coffin, COTA/L 11/12/2014, 9:57 AM

## 2014-11-12 NOTE — Progress Notes (Signed)
RT Note: Pt refusing CPAP. I told her to call if she changes her mind

## 2014-11-12 NOTE — Progress Notes (Signed)
Patient ID: Suzanne Bryan, female   DOB: Apr 20, 1961, 54 y.o.   MRN: [id number]     Subjective:  Patient reports pain as mild to moderate.  Patient states that she is having more pain because she is having to move to much and that her incision feels like it has burst open because of this. She denies any CP or SOB  Objective:   VITALS:   Filed Vitals:   11/11/14 2010 11/12/14 0000 11/12/14 0645 11/12/14 0800  BP: 116/57  118/50   Pulse: 61  61   Temp: 99 F (37.2 C)  99.9 F (37.7 C)   TempSrc: Oral  Oral   Resp: 17 18 15 18   Height:      Weight:      SpO2: 95% 95% 93% 94%    ABD soft Sensation intact distally Dorsiflexion/Plantar flexion intact Incision: dressing C/D/I and no drainage Dressing removed and the wound looks great no drainage and no sign of infection Dry dressing applied  Lab Results  Component Value Date   WBC 10.7* 11/12/2014   HGB 9.7* 11/12/2014   HCT 31.2* 11/12/2014   MCV 93.4 11/12/2014   PLT 203 11/12/2014   BMET    Component Value Date/Time   NA 135 11/12/2014 0445   K 4.6 11/12/2014 0445   CL 105 11/12/2014 0445   CO2 26 11/12/2014 0445   GLUCOSE 123* 11/12/2014 0445   BUN 8 11/12/2014 0445   CREATININE 0.95 11/12/2014 0445   CALCIUM 8.3* 11/12/2014 0445   GFRNONAA 68* 11/12/2014 0445   GFRAA 78* 11/12/2014 0445     Assessment/Plan: 3 Days Post-Op   Active Problems:   DJD (degenerative joint disease) of knee   Advance diet Up with therapy Discharge home with home health  Patient to work with PT later today and then be DC'ed home WBAT Dry dressing PRN Follow up with Dr Amada Jupiter as scheduled   Remonia Richter 11/12/2014, 10:28 AM   Patient did not pass physical therapy today, and we will hold discharge until possibly tomorrow if she is independent enough for discharge and pain adequately controlled at that point.  Discussed and agree with above.  Marchia Bond, MD Cell [phone]

## 2014-11-12 NOTE — Progress Notes (Signed)
Physical Therapy Treatment Patient Details Name: Suzanne Bryan MRN: [id number] DOB: 25-Feb-1961 Today's Date: 11/12/2014    History of Present Illness Pt is a 54 yo female admitted for R TKR.    PT Comments    Patient did much better this afternoon overall.  Still requires min guard assist during gait due to fatigue.  We did ambulate without knee immobilizer and this appears to feel better to her, less complaints of pain.  Will ask MD if we can d/c knee immobilizer.  Patient still with extremely weak quads - unable to short arc kick or long arc kick, however, able to raise legs on/off bed for bed mobility (appears somewhat inconsistent with effort).  Feel patient needs more PT to reach more independence prior to d/c home.  Will see patient in am and practice stairs and hopefully she can d/c after that session.     Follow Up Recommendations  Home health PT     Equipment Recommendations  Rolling walker with 5" wheels    Recommendations for Other Services       Precautions / Restrictions Precautions Precautions: Fall;Knee Precaution Comments: reviewed no pillow under knee Required Braces or Orthoses:  (ambulated without knee immobilizer with PT) Restrictions RLE Weight Bearing: Weight bearing as tolerated    Mobility  Bed Mobility Overal bed mobility: Modified Independent                Transfers Overall transfer level: Needs assistance Equipment used: Rolling walker (2 wheeled) Transfers: Sit to/from Stand Sit to Stand: Supervision         General transfer comment: less impulsive this afternoon, better management of RW - keeping it close, squaring up with bed, toilet before sitting.  Patient able to raise legs off bed and onto bed independently.  Ambulation/Gait Ambulation/Gait assistance: Min guard Ambulation Distance (Feet): 80 Feet Assistive device: Rolling walker (2 wheeled) Gait Pattern/deviations: Step-to pattern;Antalgic;Decreased weight shift to  right Gait velocity: decreased   General Gait Details: Ambulated without knee immoblizer this pm and did much better as less pain in knee during gait.  Less impulsive this pm.  Did still require several rest breaks during gait and reported being "tired".  Did not notice any signs of knee buckling during gait.   Stairs            Wheelchair Mobility    Modified Rankin (Stroke Patients Only)       Balance Overall balance assessment: Needs assistance Sitting-balance support: No upper extremity supported;Feet supported Sitting balance-Leahy Scale: Good     Standing balance support: Bilateral upper extremity supported;During functional activity Standing balance-Leahy Scale: Poor                      Cognition Arousal/Alertness: Awake/alert Behavior During Therapy: WFL for tasks assessed/performed Overall Cognitive Status: Within Functional Limits for tasks assessed                      Exercises Total Joint Exercises Ankle Circles/Pumps: AROM;Both;10 reps;Supine Quad Sets: AROM;Strengthening;10 reps;Supine;Both Gluteal Sets: AROM;Strengthening;Both;10 reps;Supine Short Arc Quad: AAROM;Right;10 reps;Supine Heel Slides: AAROM;Strengthening;Right;10 reps;Supine Straight Leg Raises: AAROM;Strengthening;Right;10 reps;Supine Long Arc Quad: Strengthening;Right;Seated;10 reps;AAROM Knee Flexion: AAROM;Right;5 reps;Seated    General Comments        Pertinent Vitals/Pain Pain Score: 5  Pain Location: right knee, during exercises Pain Descriptors / Indicators: Aching Pain Intervention(s): Limited activity within patient's tolerance;Monitored during session    Home Living  Prior Function            PT Goals (current goals can now be found in the care plan section) Progress towards PT goals: Progressing toward goals    Frequency  7X/week    PT Plan Current plan remains appropriate    Co-evaluation              End of Session Equipment Utilized During Treatment: Gait belt Activity Tolerance: Patient tolerated treatment well;Patient limited by fatigue Patient left: in bed;in CPM;with call bell/phone within reach;with family/visitor present     Time: 1530-1601 PT Time Calculation (min) (ACUTE ONLY): 31 min  Charges:  $Gait Training: 8-22 mins $Therapeutic Exercise: 8-22 mins                    G Codes:      Shanna Cisco 2014-11-19, 4:36 PM 11-19-14 [place], [place]

## 2014-11-12 NOTE — Progress Notes (Signed)
Physical Therapy Treatment Patient Details Name: Suzanne Bryan MRN: [id number] DOB: [birth year] Today's Date: 11/12/2014    History of Present Illness Pt is a 54 yo female admitted for R TKR.    PT Comments    Patient still very limited in mobility by lethargy (hard to keep her awake during session) and pain.  Patient impulsive during session today - pushing walker too far ahead and trying to sit down before squared up with recliner.  Patient will need additional PT before discharging home.  Will need 24 hour care at home.  Concerned that she will not mobilize at home.  Does seem to have some pain caused by knee immobilizer.  Will check with MD if it can be discontinued.  Follow Up Recommendations  Home health PT     Equipment Recommendations  Rolling walker with 5" wheels    Recommendations for Other Services       Precautions / Restrictions Precautions Precautions: Fall;Knee Required Braces or Orthoses: Knee Immobilizer - Right Knee Immobilizer - Right: On when out of bed or walking Restrictions RLE Weight Bearing: Weight bearing as tolerated    Mobility  Bed Mobility Overal bed mobility: Modified Independent Bed Mobility: Supine to Sit     Supine to sit: Supervision     General bed mobility comments: patient moved both LE off bed and then sat up and scooted to edge of bed  Transfers Overall transfer level: Needs assistance Equipment used: Rolling walker (2 wheeled) Transfers: Sit to/from Stand Sit to Stand: Min guard         General transfer comment: min guard for safety as patient somewhat impulsive during session, moving RW far ahead, reaching for chair before being squared up with it  Ambulation/Gait Ambulation/Gait assistance: Min guard Ambulation Distance (Feet): 50 Feet Assistive device: Rolling walker (2 wheeled) Gait Pattern/deviations: Step-to pattern;Decreased stride length;Antalgic;Decreased weight shift to right Gait velocity: decreased    General Gait Details: Verbal cues for safe use of RW and gait sequence.  Patient took 2 steps, crying in pain.  Patient leaned forward onto RW stating she "can't walk" due to pain.  We readjusted the knee immobilizer 3 times to decrease pain - she felt something was digging into her. and it was too tight.  Then it was too loose but she agreed to ambulate.  Patient leaning on walker intermittently throughout gait.  Required verbal cues to keep RW with her - she would push it ahead and barely have her hands on it.  Encouraged her to weight bear some on UE to decrease pain in LE.     Stairs            Wheelchair Mobility    Modified Rankin (Stroke Patients Only)       Balance   Sitting-balance support: No upper extremity supported Sitting balance-Leahy Scale: Good     Standing balance support: Bilateral upper extremity supported;During functional activity Standing balance-Leahy Scale: Poor                      Cognition Arousal/Alertness: Lethargic;Suspect due to medications Behavior During Therapy: Impulsive Overall Cognitive Status: Within Functional Limits for tasks assessed                      Exercises Total Joint Exercises Ankle Circles/Pumps: AROM;Both;10 reps;Supine Quad Sets: AROM;Strengthening;Right;10 reps;Supine Long Arc Quad: AROM;Strengthening;Right;Seated;10 reps Knee Flexion: AAROM;Right;5 reps;Seated Goniometric ROM: flexion = 80    General Comments  Pertinent Vitals/Pain Pain Assessment: 0-10 Pain Score: 9  Pain Location: right knee Pain Descriptors / Indicators: Crying;Stabbing;Tender Pain Intervention(s): Limited activity within patient's tolerance;Monitored during session;Patient requesting pain meds-RN notified;Repositioned (readjusted knee immobilizer x 3)    Home Living                      Prior Function            PT Goals (current goals can now be found in the care plan section) Progress towards PT  goals: Progressing toward goals (slowly)    Frequency  7X/week    PT Plan Current plan remains appropriate    Co-evaluation             End of Session Equipment Utilized During Treatment: Gait belt;Right knee immobilizer Activity Tolerance: Patient limited by pain;Patient limited by lethargy Patient left: in chair;with call bell/phone within reach;with family/visitor present     Time: 1128-1200 PT Time Calculation (min) (ACUTE ONLY): 32 min  Charges:  $Gait Training: 8-22 mins $Therapeutic Exercise: 8-22 mins                    G Codes:      Shanna Cisco 12/05/14, 12:29 PM  2014/12/05 [place], [place]

## 2014-11-13 LAB — GLUCOSE, CAPILLARY: GLUCOSE-CAPILLARY: 104 mg/dL — AB (ref 70–99)

## 2014-11-13 MED ORDER — CYCLOBENZAPRINE HCL 10 MG PO TABS: ORAL_TABLET | ORAL | Status: AC
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
## 2014-11-13 NOTE — Progress Notes (Signed)
Patient d/c to home.  IV removed, prescriptions given, instructions reviewed.

## 2014-11-13 NOTE — Progress Notes (Signed)
Physical Therapy Treatment Patient Details Name: FREDRIKA CANBY MRN: [id number] DOB: 06/16/1961 Today's Date: 11/13/2014    History of Present Illness      PT Comments    Stair training complete. Pt to d/c home today.  Follow Up Recommendations  Home health PT     Equipment Recommendations  Rolling walker with 5" wheels    Recommendations for Other Services       Precautions / Restrictions Precautions Precautions: Knee;Fall Required Braces or Orthoses:  (KI d/c'd 11-12-14) Restrictions RLE Weight Bearing: Weight bearing as tolerated    Mobility  Bed Mobility Overal bed mobility: Modified Independent                Transfers   Equipment used: Rolling walker (2 wheeled)   Sit to Stand: Supervision Stand pivot transfers: Supervision          Ambulation/Gait Ambulation/Gait assistance: Min guard Ambulation Distance (Feet): 200 Feet Assistive device: Rolling walker (2 wheeled) Gait Pattern/deviations: Step-to pattern;Antalgic;Decreased stride length Gait velocity: decreased   General Gait Details: multi standing rest breaks during ambulation, one seated rest break x 3 minutes   Stairs Stairs: Yes Stairs assistance: Min assist Stair Management: No rails;Backwards;With walker Number of Stairs: 3    Wheelchair Mobility    Modified Rankin (Stroke Patients Only)       Balance                                    Cognition Arousal/Alertness: Awake/alert Behavior During Therapy: WFL for tasks assessed/performed Overall Cognitive Status: Within Functional Limits for tasks assessed                      Exercises      General Comments        Pertinent Vitals/Pain Pain Assessment: 0-10 Pain Score: 10-Worst pain ever Pain Location: right knee during ambulation Pain Intervention(s): Monitored during session;Patient requesting pain meds-RN notified    Home Living                      Prior Function             PT Goals (current goals can now be found in the care plan section) Progress towards PT goals: Progressing toward goals    Frequency  7X/week    PT Plan Current plan remains appropriate    Co-evaluation             End of Session Equipment Utilized During Treatment: Gait belt Activity Tolerance: Patient tolerated treatment well Patient left: in chair;with family/visitor present;with call bell/phone within reach     Time: 1028-1057 PT Time Calculation (min) (ACUTE ONLY): 29 min  Charges:  $Gait Training: 23-37 mins                    G Codes:      Lorriane Shire 11/13/2014, 11:23 AM

## 2014-11-28 ENCOUNTER — Ambulatory Visit (INDEPENDENT_AMBULATORY_CARE_PROVIDER_SITE_OTHER): Payer: PRIVATE HEALTH INSURANCE | Admitting: Physical Therapy

## 2014-11-28 DIAGNOSIS — M6281 Muscle weakness (generalized): Secondary | ICD-10-CM

## 2014-11-28 DIAGNOSIS — M25669 Stiffness of unspecified knee, not elsewhere classified: Secondary | ICD-10-CM

## 2014-11-28 DIAGNOSIS — R2689 Other abnormalities of gait and mobility: Secondary | ICD-10-CM

## 2014-11-28 DIAGNOSIS — M25569 Pain in unspecified knee: Secondary | ICD-10-CM

## 2014-11-28 DIAGNOSIS — Z96651 Presence of right artificial knee joint: Secondary | ICD-10-CM

## 2014-11-28 DIAGNOSIS — R600 Localized edema: Secondary | ICD-10-CM

## 2014-11-30 ENCOUNTER — Encounter: Payer: PRIVATE HEALTH INSURANCE | Admitting: Physical Therapy

## 2014-12-02 ENCOUNTER — Encounter (INDEPENDENT_AMBULATORY_CARE_PROVIDER_SITE_OTHER): Payer: PRIVATE HEALTH INSURANCE | Admitting: Physical Therapy

## 2014-12-02 DIAGNOSIS — M25569 Pain in unspecified knee: Secondary | ICD-10-CM

## 2014-12-02 DIAGNOSIS — R2689 Other abnormalities of gait and mobility: Secondary | ICD-10-CM

## 2014-12-02 DIAGNOSIS — R600 Localized edema: Secondary | ICD-10-CM

## 2014-12-02 DIAGNOSIS — M6281 Muscle weakness (generalized): Secondary | ICD-10-CM

## 2014-12-02 DIAGNOSIS — M25669 Stiffness of unspecified knee, not elsewhere classified: Secondary | ICD-10-CM

## 2014-12-02 DIAGNOSIS — Z96651 Presence of right artificial knee joint: Secondary | ICD-10-CM

## 2014-12-05 ENCOUNTER — Encounter (INDEPENDENT_AMBULATORY_CARE_PROVIDER_SITE_OTHER): Payer: PRIVATE HEALTH INSURANCE | Admitting: Physical Therapy

## 2014-12-05 DIAGNOSIS — M25669 Stiffness of unspecified knee, not elsewhere classified: Secondary | ICD-10-CM

## 2014-12-05 DIAGNOSIS — M25569 Pain in unspecified knee: Secondary | ICD-10-CM

## 2014-12-05 DIAGNOSIS — M6281 Muscle weakness (generalized): Secondary | ICD-10-CM

## 2014-12-05 DIAGNOSIS — Z96651 Presence of right artificial knee joint: Secondary | ICD-10-CM

## 2014-12-05 DIAGNOSIS — R600 Localized edema: Secondary | ICD-10-CM

## 2014-12-07 ENCOUNTER — Encounter: Payer: PRIVATE HEALTH INSURANCE | Admitting: Physical Therapy

## 2014-12-09 ENCOUNTER — Encounter: Payer: PRIVATE HEALTH INSURANCE | Admitting: Physical Therapy

## 2014-12-12 ENCOUNTER — Encounter (INDEPENDENT_AMBULATORY_CARE_PROVIDER_SITE_OTHER): Payer: PRIVATE HEALTH INSURANCE | Admitting: Physical Therapy

## 2014-12-12 DIAGNOSIS — M25669 Stiffness of unspecified knee, not elsewhere classified: Secondary | ICD-10-CM

## 2014-12-12 DIAGNOSIS — Z96651 Presence of right artificial knee joint: Secondary | ICD-10-CM

## 2014-12-12 DIAGNOSIS — R2689 Other abnormalities of gait and mobility: Secondary | ICD-10-CM

## 2014-12-12 DIAGNOSIS — R600 Localized edema: Secondary | ICD-10-CM

## 2014-12-12 DIAGNOSIS — M6281 Muscle weakness (generalized): Secondary | ICD-10-CM

## 2014-12-14 ENCOUNTER — Encounter (INDEPENDENT_AMBULATORY_CARE_PROVIDER_SITE_OTHER): Payer: PRIVATE HEALTH INSURANCE | Admitting: Physical Therapy

## 2014-12-14 DIAGNOSIS — M25669 Stiffness of unspecified knee, not elsewhere classified: Secondary | ICD-10-CM

## 2014-12-14 DIAGNOSIS — M25569 Pain in unspecified knee: Secondary | ICD-10-CM

## 2014-12-14 DIAGNOSIS — Z96651 Presence of right artificial knee joint: Secondary | ICD-10-CM

## 2014-12-14 DIAGNOSIS — M6281 Muscle weakness (generalized): Secondary | ICD-10-CM

## 2014-12-14 DIAGNOSIS — R600 Localized edema: Secondary | ICD-10-CM

## 2014-12-16 ENCOUNTER — Encounter: Payer: PRIVATE HEALTH INSURANCE | Admitting: Physical Therapy

## 2014-12-19 ENCOUNTER — Encounter: Payer: PRIVATE HEALTH INSURANCE | Admitting: Physical Therapy

## 2014-12-21 ENCOUNTER — Ambulatory Visit (INDEPENDENT_AMBULATORY_CARE_PROVIDER_SITE_OTHER): Payer: PRIVATE HEALTH INSURANCE | Admitting: Physical Therapy

## 2014-12-21 ENCOUNTER — Encounter: Payer: PRIVATE HEALTH INSURANCE | Admitting: Physical Therapy

## 2014-12-21 DIAGNOSIS — Z96651 Presence of right artificial knee joint: Secondary | ICD-10-CM

## 2014-12-21 DIAGNOSIS — M25569 Pain in unspecified knee: Secondary | ICD-10-CM

## 2014-12-21 DIAGNOSIS — R269 Unspecified abnormalities of gait and mobility: Secondary | ICD-10-CM

## 2014-12-21 DIAGNOSIS — M25561 Pain in right knee: Secondary | ICD-10-CM

## 2014-12-21 DIAGNOSIS — R600 Localized edema: Secondary | ICD-10-CM

## 2014-12-21 DIAGNOSIS — M25661 Stiffness of right knee, not elsewhere classified: Secondary | ICD-10-CM

## 2014-12-21 DIAGNOSIS — R29898 Other symptoms and signs involving the musculoskeletal system: Secondary | ICD-10-CM

## 2014-12-21 DIAGNOSIS — M25669 Stiffness of unspecified knee, not elsewhere classified: Secondary | ICD-10-CM

## 2014-12-21 DIAGNOSIS — M7989 Other specified soft tissue disorders: Secondary | ICD-10-CM

## 2014-12-21 DIAGNOSIS — M6281 Muscle weakness (generalized): Secondary | ICD-10-CM
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
## 2014-12-23 ENCOUNTER — Encounter: Payer: PRIVATE HEALTH INSURANCE | Admitting: Physical Therapy

## 2014-12-26 ENCOUNTER — Encounter: Payer: PRIVATE HEALTH INSURANCE | Admitting: Physical Therapy

## 2014-12-28 ENCOUNTER — Encounter: Payer: PRIVATE HEALTH INSURANCE | Admitting: Physical Therapy

## 2014-12-30 ENCOUNTER — Ambulatory Visit (INDEPENDENT_AMBULATORY_CARE_PROVIDER_SITE_OTHER): Payer: PRIVATE HEALTH INSURANCE | Admitting: Physical Therapy

## 2014-12-30 DIAGNOSIS — R29898 Other symptoms and signs involving the musculoskeletal system: Secondary | ICD-10-CM

## 2014-12-30 DIAGNOSIS — M7989 Other specified soft tissue disorders: Secondary | ICD-10-CM

## 2014-12-30 DIAGNOSIS — R269 Unspecified abnormalities of gait and mobility: Secondary | ICD-10-CM

## 2014-12-30 DIAGNOSIS — M25561 Pain in right knee: Secondary | ICD-10-CM

## 2014-12-30 DIAGNOSIS — M25661 Stiffness of right knee, not elsewhere classified: Secondary | ICD-10-CM
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
## 2015-01-02 ENCOUNTER — Ambulatory Visit (INDEPENDENT_AMBULATORY_CARE_PROVIDER_SITE_OTHER): Payer: PRIVATE HEALTH INSURANCE | Admitting: Physical Therapy

## 2015-01-02 ENCOUNTER — Encounter: Payer: Self-pay | Admitting: Physical Therapy

## 2015-01-02 DIAGNOSIS — R29898 Other symptoms and signs involving the musculoskeletal system: Secondary | ICD-10-CM

## 2015-01-02 DIAGNOSIS — M7989 Other specified soft tissue disorders: Secondary | ICD-10-CM

## 2015-01-02 DIAGNOSIS — M25661 Stiffness of right knee, not elsewhere classified: Secondary | ICD-10-CM

## 2015-01-02 DIAGNOSIS — M25561 Pain in right knee: Secondary | ICD-10-CM

## 2015-01-02 DIAGNOSIS — R269 Unspecified abnormalities of gait and mobility: Secondary | ICD-10-CM
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
## 2015-01-04 ENCOUNTER — Ambulatory Visit (INDEPENDENT_AMBULATORY_CARE_PROVIDER_SITE_OTHER): Payer: PRIVATE HEALTH INSURANCE | Admitting: Physical Therapy

## 2015-01-04 DIAGNOSIS — R29898 Other symptoms and signs involving the musculoskeletal system: Secondary | ICD-10-CM

## 2015-01-04 DIAGNOSIS — M7989 Other specified soft tissue disorders: Secondary | ICD-10-CM

## 2015-01-04 DIAGNOSIS — M25661 Stiffness of right knee, not elsewhere classified: Secondary | ICD-10-CM

## 2015-01-04 DIAGNOSIS — M25561 Pain in right knee: Secondary | ICD-10-CM

## 2015-01-04 NOTE — Patient Instructions (Signed)
   Body-Weight Side Lunge: Stable - Stationary (Active)   Stand with head up, back flat. Step wider than shoulder width apart. Toes point forward, lunge side to side, leaning slightly forward.  Complete __2_ sets of _10__ repetitions. Perform __1_ sessions per day.  Copyright  VHI. All rights reserved.  Step-Down / Step-Up   Stand on stair step or __6__ inch stool. Slowly bend RIGHT leg, slowly lowering other foot to floor. Return by straightening front leg. Repeat __12__ times per set. Do __2__ sets per session. Do ___1_ sessions per day.  http://orth.exer.us/684   Copyright  VHI. All rights reserved.
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
## 2015-01-06 ENCOUNTER — Ambulatory Visit (INDEPENDENT_AMBULATORY_CARE_PROVIDER_SITE_OTHER): Payer: PRIVATE HEALTH INSURANCE | Admitting: Physical Therapy

## 2015-01-06 DIAGNOSIS — M25661 Stiffness of right knee, not elsewhere classified: Secondary | ICD-10-CM

## 2015-01-06 DIAGNOSIS — M7989 Other specified soft tissue disorders: Secondary | ICD-10-CM

## 2015-01-06 DIAGNOSIS — R29898 Other symptoms and signs involving the musculoskeletal system: Secondary | ICD-10-CM

## 2015-01-06 DIAGNOSIS — M25561 Pain in right knee: Secondary | ICD-10-CM
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
## 2015-01-09 ENCOUNTER — Ambulatory Visit (INDEPENDENT_AMBULATORY_CARE_PROVIDER_SITE_OTHER): Payer: PRIVATE HEALTH INSURANCE | Admitting: Physical Therapy

## 2015-01-09 ENCOUNTER — Encounter: Payer: Self-pay | Admitting: Physical Therapy

## 2015-01-09 DIAGNOSIS — M7989 Other specified soft tissue disorders: Secondary | ICD-10-CM

## 2015-01-09 DIAGNOSIS — M25661 Stiffness of right knee, not elsewhere classified: Secondary | ICD-10-CM

## 2015-01-09 DIAGNOSIS — M25561 Pain in right knee: Secondary | ICD-10-CM

## 2015-01-09 DIAGNOSIS — R29898 Other symptoms and signs involving the musculoskeletal system: Secondary | ICD-10-CM
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
## 2015-01-11 ENCOUNTER — Encounter: Payer: PRIVATE HEALTH INSURANCE | Admitting: Physical Therapy

## 2015-01-13 ENCOUNTER — Ambulatory Visit (INDEPENDENT_AMBULATORY_CARE_PROVIDER_SITE_OTHER): Payer: PRIVATE HEALTH INSURANCE | Admitting: Physical Therapy

## 2015-01-13 DIAGNOSIS — M25661 Stiffness of right knee, not elsewhere classified: Secondary | ICD-10-CM

## 2015-01-13 DIAGNOSIS — R29898 Other symptoms and signs involving the musculoskeletal system: Secondary | ICD-10-CM

## 2015-01-13 DIAGNOSIS — M25561 Pain in right knee: Secondary | ICD-10-CM

## 2015-01-13 DIAGNOSIS — M7989 Other specified soft tissue disorders: Secondary | ICD-10-CM
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
## 2015-01-16 ENCOUNTER — Ambulatory Visit (INDEPENDENT_AMBULATORY_CARE_PROVIDER_SITE_OTHER): Payer: PRIVATE HEALTH INSURANCE | Admitting: Physical Therapy

## 2015-01-16 DIAGNOSIS — M7989 Other specified soft tissue disorders: Secondary | ICD-10-CM

## 2015-01-16 DIAGNOSIS — M25661 Stiffness of right knee, not elsewhere classified: Secondary | ICD-10-CM

## 2015-01-16 DIAGNOSIS — R29898 Other symptoms and signs involving the musculoskeletal system: Secondary | ICD-10-CM

## 2015-01-16 DIAGNOSIS — M25561 Pain in right knee: Secondary | ICD-10-CM

## 2015-01-16 NOTE — Patient Instructions (Signed)
Knee Flexion: Sitting (Single Leg)   Sit facing anchor, leg extended. Tubing looped around ankle, flex knee, pulling back. Repeat _10_ times per set. Repeat with other leg. Do _3_ sets per session. Do 3__ sessions per week. Anchor Height: Knee  [email or web address].exer.[email or web address]   Copyright  VHI. All rights reserved.   [facility] [place], [place] [address]  [phone] (office) [phone] (fax)
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
## 2015-01-17 ENCOUNTER — Encounter: Payer: PRIVATE HEALTH INSURANCE | Admitting: Physical Therapy

## 2015-01-18 ENCOUNTER — Encounter: Payer: PRIVATE HEALTH INSURANCE | Admitting: Physical Therapy

## 2015-01-20 ENCOUNTER — Ambulatory Visit (INDEPENDENT_AMBULATORY_CARE_PROVIDER_SITE_OTHER): Payer: PRIVATE HEALTH INSURANCE | Admitting: Physical Therapy

## 2015-01-20 DIAGNOSIS — R29898 Other symptoms and signs involving the musculoskeletal system: Secondary | ICD-10-CM

## 2015-01-20 DIAGNOSIS — M25661 Stiffness of right knee, not elsewhere classified: Secondary | ICD-10-CM

## 2015-01-20 DIAGNOSIS — M7989 Other specified soft tissue disorders: Secondary | ICD-10-CM

## 2015-01-20 DIAGNOSIS — M25561 Pain in right knee: Secondary | ICD-10-CM
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
## 2015-01-23 ENCOUNTER — Encounter (INDEPENDENT_AMBULATORY_CARE_PROVIDER_SITE_OTHER): Payer: Self-pay

## 2015-01-23 ENCOUNTER — Ambulatory Visit (INDEPENDENT_AMBULATORY_CARE_PROVIDER_SITE_OTHER): Payer: PRIVATE HEALTH INSURANCE | Admitting: Physical Therapy

## 2015-01-23 DIAGNOSIS — M25661 Stiffness of right knee, not elsewhere classified: Secondary | ICD-10-CM

## 2015-01-23 DIAGNOSIS — M25561 Pain in right knee: Secondary | ICD-10-CM

## 2015-01-23 DIAGNOSIS — R29898 Other symptoms and signs involving the musculoskeletal system: Secondary | ICD-10-CM

## 2015-01-23 DIAGNOSIS — M7989 Other specified soft tissue disorders: Secondary | ICD-10-CM
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
## 2015-01-25 ENCOUNTER — Encounter: Payer: PRIVATE HEALTH INSURANCE | Admitting: Physical Therapy

## 2015-01-30 ENCOUNTER — Encounter: Payer: Self-pay | Admitting: Physical Therapy

## 2015-01-30 ENCOUNTER — Encounter (INDEPENDENT_AMBULATORY_CARE_PROVIDER_SITE_OTHER): Payer: Self-pay

## 2015-01-30 ENCOUNTER — Ambulatory Visit (INDEPENDENT_AMBULATORY_CARE_PROVIDER_SITE_OTHER): Payer: PRIVATE HEALTH INSURANCE | Admitting: Physical Therapy

## 2015-01-30 DIAGNOSIS — M25661 Stiffness of right knee, not elsewhere classified: Secondary | ICD-10-CM

## 2015-01-30 DIAGNOSIS — M25561 Pain in right knee: Secondary | ICD-10-CM

## 2015-01-30 DIAGNOSIS — R29898 Other symptoms and signs involving the musculoskeletal system: Secondary | ICD-10-CM | POA: Diagnosis not present

## 2015-01-30 DIAGNOSIS — M7989 Other specified soft tissue disorders: Secondary | ICD-10-CM

## 2015-01-30 DIAGNOSIS — R269 Unspecified abnormalities of gait and mobility: Secondary | ICD-10-CM
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
## 2016-01-03 DEATH — deceased

## 2016-02-17 IMAGING — CR DG KNEE 1-2V PORT*R*
1 series · 1 of 1 positions shown · non-contrast
Comparison: Portable exam 8301 hr compared to 04/06/2014

CLINICAL DATA: Post RIGHT total knee replacement

EXAM:
PORTABLE RIGHT KNEE - 1-2 VIEW

[AP]
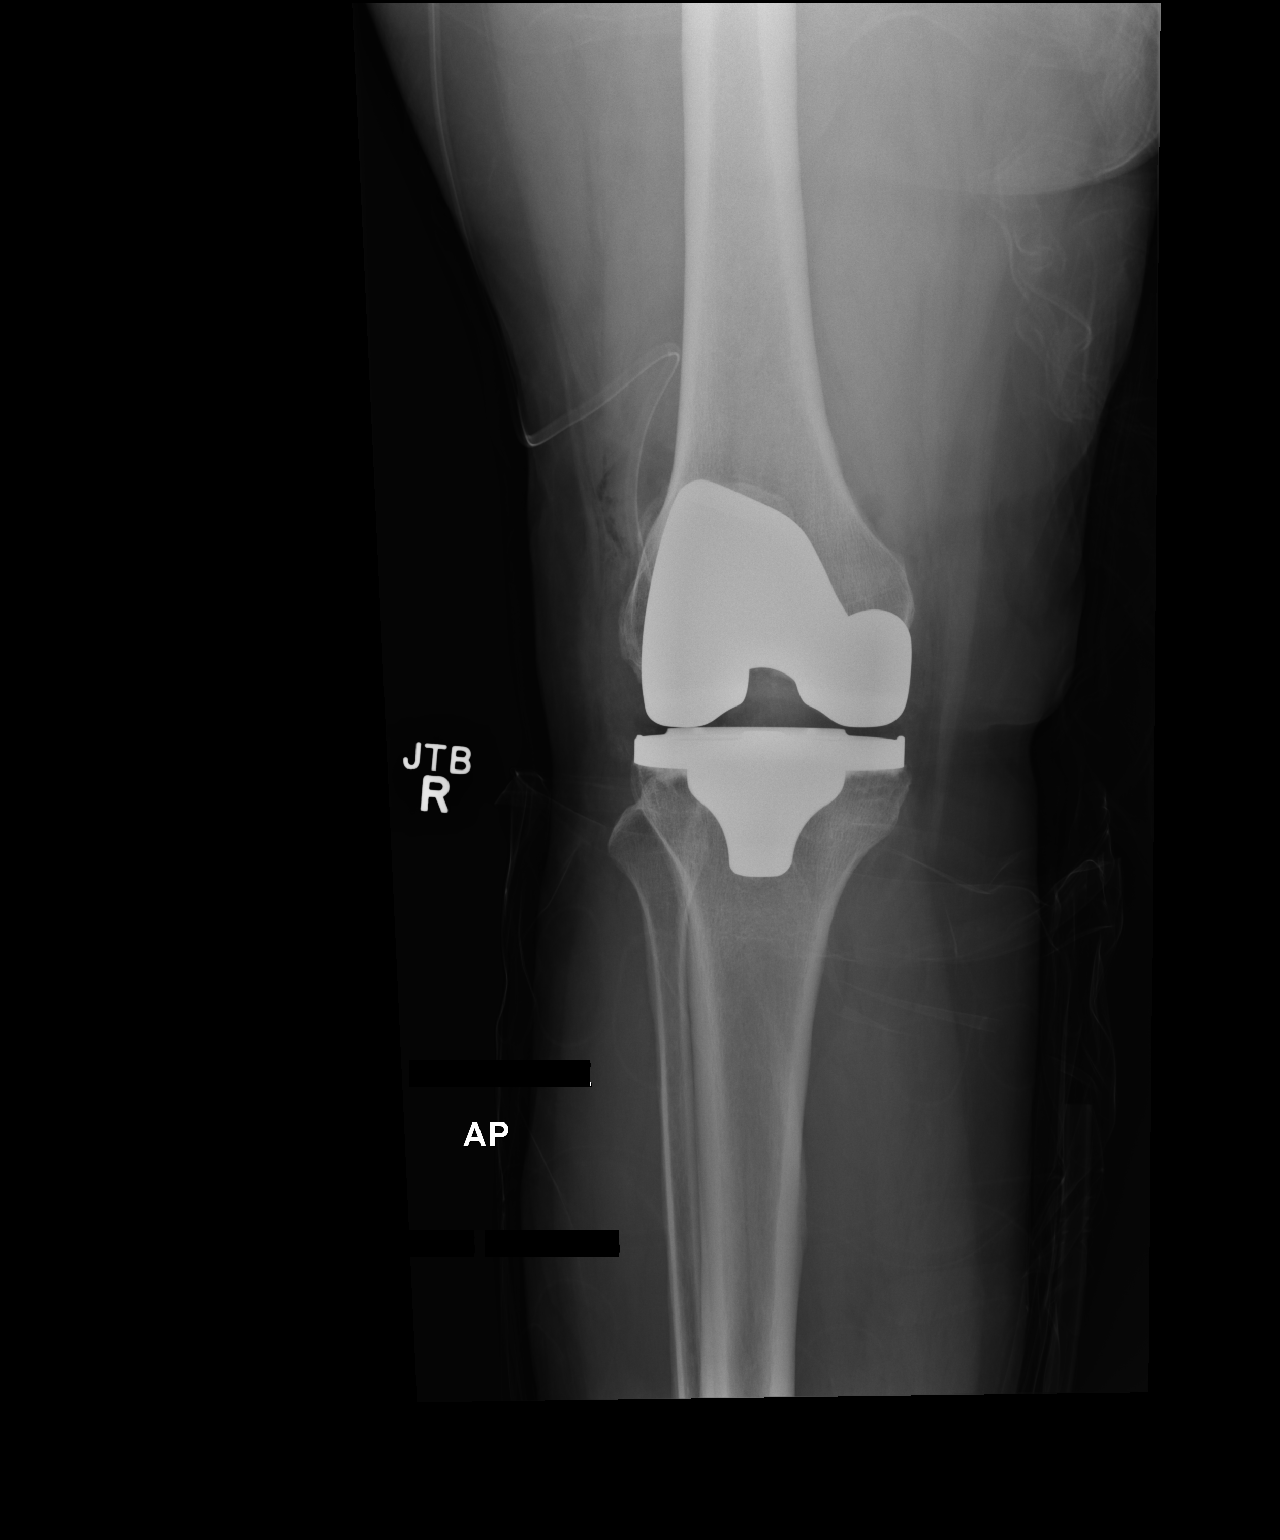

[1 of 1 positions shown; findings below may reference images not displayed]

FINDINGS: Components of RIGHT knee prosthesis in expected positions.

No acute fracture, dislocation or bone destruction.

Anterior surgical drain noted.

No periprosthetic lucency or acute complication identified.
IMPRESSION: RIGHT knee prosthesis without radiographic evidence of acute
complication.
